# Patient Record
Sex: Female | Born: 1965 | Race: Black or African American | Hispanic: No | Smoking: Former smoker
Health system: Southern US, Community
[De-identification: ages and names within clinical notes are randomized; demographics above are authoritative.]

## PROBLEM LIST (undated history)

## (undated) DIAGNOSIS — H548 Legal blindness, as defined in USA: Secondary | ICD-10-CM

## (undated) DIAGNOSIS — D573 Sickle-cell trait: Secondary | ICD-10-CM

## (undated) DIAGNOSIS — E7800 Pure hypercholesterolemia, unspecified: Secondary | ICD-10-CM

## (undated) DIAGNOSIS — J45909 Unspecified asthma, uncomplicated: Secondary | ICD-10-CM

## (undated) DIAGNOSIS — I10 Essential (primary) hypertension: Secondary | ICD-10-CM

## (undated) HISTORY — PX: OTHER SURGICAL HISTORY: SHX169

## (undated) HISTORY — PX: RETINAL DETACHMENT SURGERY: SHX105

## (undated) HISTORY — PX: CHOLECYSTECTOMY: SHX55

## (undated) HISTORY — PX: CATARACT EXTRACTION, BILATERAL: SHX1313

## (undated) HISTORY — PX: ABDOMINAL HYSTERECTOMY: SHX81

---

## 1998-01-06 ENCOUNTER — Emergency Department (HOSPITAL_COMMUNITY): Admission: EM | Admit: 1998-01-06 | Discharge: 1998-01-06 | Payer: Self-pay | Admitting: Emergency Medicine

## 1998-02-26 ENCOUNTER — Ambulatory Visit (HOSPITAL_COMMUNITY): Admission: RE | Admit: 1998-02-26 | Discharge: 1998-02-26 | Payer: Self-pay | Admitting: Internal Medicine

## 1998-06-13 ENCOUNTER — Emergency Department (HOSPITAL_COMMUNITY): Admission: EM | Admit: 1998-06-13 | Discharge: 1998-06-13 | Payer: Self-pay | Admitting: Emergency Medicine

## 1999-05-20 ENCOUNTER — Emergency Department (HOSPITAL_COMMUNITY): Admission: EM | Admit: 1999-05-20 | Discharge: 1999-05-20 | Payer: Self-pay | Admitting: Emergency Medicine

## 1999-05-20 ENCOUNTER — Encounter: Payer: Self-pay | Admitting: Emergency Medicine

## 1999-05-21 ENCOUNTER — Encounter: Payer: Self-pay | Admitting: Emergency Medicine

## 1999-12-30 ENCOUNTER — Emergency Department (HOSPITAL_COMMUNITY): Admission: EM | Admit: 1999-12-30 | Discharge: 1999-12-30 | Payer: Self-pay | Admitting: *Deleted

## 1999-12-30 ENCOUNTER — Encounter: Payer: Self-pay | Admitting: *Deleted

## 2000-01-01 ENCOUNTER — Emergency Department (HOSPITAL_COMMUNITY): Admission: EM | Admit: 2000-01-01 | Discharge: 2000-01-01 | Payer: Self-pay | Admitting: Emergency Medicine

## 2001-09-24 ENCOUNTER — Other Ambulatory Visit: Admission: RE | Admit: 2001-09-24 | Discharge: 2001-09-24 | Payer: Self-pay | Admitting: Family Medicine

## 2001-11-06 ENCOUNTER — Ambulatory Visit (HOSPITAL_COMMUNITY): Admission: RE | Admit: 2001-11-06 | Discharge: 2001-11-18 | Payer: Self-pay | Admitting: Obstetrics and Gynecology

## 2001-11-18 ENCOUNTER — Encounter: Payer: Self-pay | Admitting: Obstetrics and Gynecology

## 2001-12-14 ENCOUNTER — Encounter: Payer: Self-pay | Admitting: Obstetrics and Gynecology

## 2001-12-14 ENCOUNTER — Inpatient Hospital Stay: Admission: AD | Admit: 2001-12-14 | Discharge: 2001-12-14 | Payer: Self-pay | Admitting: Obstetrics and Gynecology

## 2002-01-20 ENCOUNTER — Other Ambulatory Visit: Admission: RE | Admit: 2002-01-20 | Discharge: 2002-01-20 | Payer: Self-pay | Admitting: Obstetrics and Gynecology

## 2002-07-17 ENCOUNTER — Ambulatory Visit (HOSPITAL_COMMUNITY): Admission: RE | Admit: 2002-07-17 | Discharge: 2002-07-17 | Payer: Self-pay | Admitting: Obstetrics and Gynecology

## 2002-07-17 ENCOUNTER — Encounter: Payer: Self-pay | Admitting: Obstetrics and Gynecology

## 2003-06-13 ENCOUNTER — Emergency Department (HOSPITAL_COMMUNITY): Admission: EM | Admit: 2003-06-13 | Discharge: 2003-06-13 | Payer: Self-pay | Admitting: Emergency Medicine

## 2005-03-04 ENCOUNTER — Emergency Department (HOSPITAL_COMMUNITY): Admission: EM | Admit: 2005-03-04 | Discharge: 2005-03-04 | Payer: Self-pay | Admitting: Emergency Medicine

## 2005-08-12 ENCOUNTER — Emergency Department (HOSPITAL_COMMUNITY): Admission: EM | Admit: 2005-08-12 | Discharge: 2005-08-12 | Payer: Self-pay | Admitting: Family Medicine

## 2006-05-21 ENCOUNTER — Inpatient Hospital Stay (HOSPITAL_COMMUNITY): Admission: EM | Admit: 2006-05-21 | Discharge: 2006-05-24 | Payer: Self-pay | Admitting: *Deleted

## 2006-06-27 ENCOUNTER — Inpatient Hospital Stay (HOSPITAL_COMMUNITY): Admission: AD | Admit: 2006-06-27 | Discharge: 2006-07-01 | Payer: Self-pay | Admitting: Obstetrics and Gynecology

## 2006-06-28 ENCOUNTER — Encounter (INDEPENDENT_AMBULATORY_CARE_PROVIDER_SITE_OTHER): Payer: Self-pay | Admitting: *Deleted

## 2006-07-05 ENCOUNTER — Observation Stay (HOSPITAL_COMMUNITY): Admission: AD | Admit: 2006-07-05 | Discharge: 2006-07-06 | Payer: Self-pay | Admitting: Obstetrics and Gynecology

## 2006-07-07 ENCOUNTER — Inpatient Hospital Stay (HOSPITAL_COMMUNITY): Admission: AD | Admit: 2006-07-07 | Discharge: 2006-07-11 | Payer: Self-pay | Admitting: Obstetrics and Gynecology

## 2006-07-07 ENCOUNTER — Encounter: Payer: Self-pay | Admitting: Emergency Medicine

## 2006-07-10 ENCOUNTER — Encounter (INDEPENDENT_AMBULATORY_CARE_PROVIDER_SITE_OTHER): Payer: Self-pay | Admitting: *Deleted

## 2006-07-17 ENCOUNTER — Ambulatory Visit: Payer: Self-pay | Admitting: Internal Medicine

## 2006-10-09 ENCOUNTER — Ambulatory Visit (HOSPITAL_COMMUNITY): Admission: RE | Admit: 2006-10-09 | Discharge: 2006-10-09 | Payer: Self-pay | Admitting: General Surgery

## 2007-05-08 ENCOUNTER — Emergency Department (HOSPITAL_COMMUNITY): Admission: EM | Admit: 2007-05-08 | Discharge: 2007-05-08 | Payer: Self-pay | Admitting: Emergency Medicine

## 2007-05-22 ENCOUNTER — Emergency Department (HOSPITAL_COMMUNITY): Admission: EM | Admit: 2007-05-22 | Discharge: 2007-05-22 | Payer: Self-pay | Admitting: Family Medicine

## 2007-12-03 ENCOUNTER — Ambulatory Visit (HOSPITAL_COMMUNITY): Admission: RE | Admit: 2007-12-03 | Discharge: 2007-12-04 | Payer: Self-pay | Admitting: Ophthalmology

## 2008-02-29 ENCOUNTER — Emergency Department (HOSPITAL_COMMUNITY): Admission: EM | Admit: 2008-02-29 | Discharge: 2008-02-29 | Payer: Self-pay | Admitting: Emergency Medicine

## 2008-03-31 ENCOUNTER — Ambulatory Visit (HOSPITAL_COMMUNITY): Admission: RE | Admit: 2008-03-31 | Discharge: 2008-04-01 | Payer: Self-pay | Admitting: Ophthalmology

## 2009-11-22 ENCOUNTER — Ambulatory Visit: Payer: Self-pay | Admitting: Internal Medicine

## 2009-11-22 ENCOUNTER — Observation Stay (HOSPITAL_COMMUNITY): Admission: EM | Admit: 2009-11-22 | Discharge: 2009-11-22 | Payer: Self-pay | Admitting: Emergency Medicine

## 2010-10-18 LAB — POCT CARDIAC MARKERS
CKMB, poc: 1.4 ng/mL (ref 1.0–8.0)
CKMB, poc: <1 ng/mL — ABNORMAL LOW (ref 1.0–8.0)
Troponin i, poc: <0.05 ng/mL (ref 0.00–0.09)
Troponin i, poc: <0.05 ng/mL (ref 0.00–0.09)

## 2010-10-18 LAB — POCT I-STAT, CHEM 8
Chloride: 116 mEq/L — ABNORMAL HIGH (ref 96–112)
HCT: 30 % — ABNORMAL LOW (ref 36.0–46.0)
Potassium: 5.2 mEq/L — ABNORMAL HIGH (ref 3.5–5.1)
Sodium: 139 mEq/L (ref 135–145)

## 2010-10-18 LAB — BRAIN NATRIURETIC PEPTIDE: Pro B Natriuretic peptide (BNP): 35 pg/mL (ref 0.0–100.0)

## 2010-12-13 NOTE — Op Note (Signed)
NAME:  Kirsten Shaffer, Kirsten Shaffer         ACCOUNT NO.:  [id number]   MEDICAL RECORD NO.:  [id number]          PATIENT TYPE:  OIB   LOCATION:  5150                         FACILITY:  [facility]   PHYSICIAN:  Chalmers Guest, M.D.     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   PROCEDURES:  Anterior chamber washout and Optonol mini Ex-Press shunt  with mitomycin-C.   SURGEON:  Chalmers Guest, MD   COMPLICATIONS:  None.   ANESTHESIA:  General.   PROCEDURE:  The patient was taken to the operating room.  After  induction of general anesthesia, her face was prepped and draped in  usual sterile fashion with the surgeon sitting superiorly at the  operating microscope.  A 6-0 nylon suture was passed through clear  cornea to infraduct the eye.  With the eye in the infraduct position, a  Hoskins forceps was used to grasp the conjunctiva at the limbus.  A  Westcott scissor was used to incise the conjunctiva at the limbus and  the conjunctival tissue was relaxed using blunt dissection with the  blunt Westcott scissor.  A Weck-Cel sponge was used to recess the  conjunctiva and a Tooke blade was used to recess Tenon's tissue.  There  was bleeding present.  Bleeding was controlled with cautery.  There was  a small excision of Tenon's and vessels in the perilimbal area.  Additional cautery was applied.  Following this, a 45-degree blade was  used to fashion a half-thickness scleral flap with the base of 4 mm.  A  Maumenee-Colibri forceps was used to elevate the scleral flap and a 5700  Grieshaber  blade was used to dissect the flap to the limbus until the  limbal tissues were identified.  At this point, mitomycin-C 0.4 mg/mL  was placed on a Gelfoam sponge and placed under the conjunctiva and  under the sclera for 2 minutes.  It was then removed and irrigated with  60 mL of balanced salt solution.  At this point, a paracentesis was  formed through clear cornea at the 9 o'clock position and at the 4  o'clock position.  BSS was irrigated into the anterior chamber from an  irrigation bottle until the clot that was seen at the 6 o'clock position  had become very small and all pieces of the visible clot were gone, the  chamber deepened.  Following this, leaving the chamber deep, the scleral  flap again was elevated.  A 26-gauge needle was passed into the anterior  chamber under the scleral flap at the limbal junction after a small  amount of Provisc had been injected in the anterior chamber.  An  additional amount of Provisc was injected as the needle entered the eye.  The  needle was withdrawn and the Express shunt was a preloaded EDSP50,  SN# [id number], lot# [id number].  It was advanced through the aforementioned  opening and securely placed in the anterior chamber without touching the  cornea or the iris or the lens.  The forceps were used to hold the shunt  as the injector was removed.  Four interrupted 9-0 nylon sutures were  used to secure the scleral flap.  BSS was injected, the chamber  deepened, and the fluid egressed.  Following this, the conjunctiva was  then sutured with a 9-0 Vicryl on a BV-100 needle.  BSS was injected.  The incision was checked for leakage with fluorescein.  A small amount  of leakage was noted.  An additional suture was placed.  The chamber was  deepened and the incision was  tested with fluorescein and was now Seidel negative.  A subconjunctival  injection of Kenalog 4 mg was given at the 5 o'clock position.  Topical  TobraDex ointment was applied to the eye.   A patch and Fox shield were  placed.  After the anesthesia recovery, the patient returned to the  recovery area in stable condition.      Chalmers Guest, M.D.  Electronically Signed     RW/MEDQ  D:  04/01/2008  T:  04/01/2008  Job:  [id number]   cc:   Fax# [phone]

## 2010-12-13 NOTE — Op Note (Signed)
NAME:  Kirsten Shaffer, Kirsten Shaffer         ACCOUNT NO.:  [id number]   MEDICAL RECORD NO.:  [id number]          PATIENT TYPE:  AMB   LOCATION:  SDS                          FACILITY:  [facility]   PHYSICIAN:  John D. Ashley Royalty, M.D. DATE OF BIRTH:  14-Jan-1966   DATE OF PROCEDURE:  12/03/2007  DATE OF DISCHARGE:                               OPERATIVE REPORT   ADMISSION DIAGNOSIS:  Severe proliferative diabetic retinopathy, surface  membranes, neovascularization, and vitreous hemorrhage, left eye.   PROCEDURES:  Pars plana vitrectomy with membrane peel, retinal  photocoagulation, gas fluid exchange, endodiathermy in the left eye.   SURGEON:  Beulah Gandy. Ashley Royalty, MD   ASSISTANT:  Rosalie Doctor, MA   ANESTHESIA:  General.   PROCEDURE IN DETAIL:  After usual prep and drape, 25 gauge trocars  placed at 10, 2, and 4 o'clock.  Infusion at 4 o'clock.  Provisc placed  on the corneal surface.  The Biome viewing system moved into place.  Pars plana vitrectomy was begun just behind the cataractous lens.  The  vitrectomy was carried out into the peripheral vitreous area down to the  vitreous base for 360 degrees.  The vitreous was trimmed in this area.  Attention was then carried down to the macular region where a large  epiretinal neovascular membrane was covering the disk in the superior  and inferior arcades and the superior nasal arcades.  The membrane was  incised with the vitreous cutter and the lighted pick.  It was peeled  free from its attachment to the disk with forceps.  It was trimmed out  to its extent with the vitreous cutter.  Endo cautery was used as well  as the endolaser for hemostasis.  The plug was removed from the optic  nerve head.  The plane of posterior hyaloid was then followed out for  360 degrees past the equator and a surface proliferation was removed  from the retinal surface.  498 burns of endolaser was placed around the  retinal periphery and on leaky blood vessels.  The power  was 1000  milliwatts, 1000 microns each, and 0.1 seconds each.  All blood vessels  were sealed.  Silicone tip suction line was used to vacuum blood from  the retinal surface and it was cleaned at the end of the case.  A total  gas fluid exchange was performed.  The instruments were removed from the  eye and the conjunctiva was allowed to slide over the scleral wounds.  No leakage was seen.  Polymyxin and gentamicin were irrigated in Tenon  space.  Atropine solution was applied.  Marcaine was injected around the  globe for postop pain.  Decadron 10 mg was injected into the lower  subconjunctival space.  TobraDex ophthalmic ointment and patch and  shield were placed.  Closing pressure was 21 with a Barraquer tonometer.  Complications none.  Duration 45 minutes.  The patient was awakened and  taken to recovery in satisfactory condition.  Patch and shield were  placed.      Beulah Gandy. Ashley Royalty, M.D.  Electronically Signed    JDM/MEDQ  D:  12/03/2007  T:  12/04/2007  Job:  [id number]

## 2010-12-16 NOTE — Discharge Summary (Signed)
NAMEDONNE, ROBILLARD                  ACCOUNT NO.:  [id number]   MEDICAL RECORD NO.:  [id number]          PATIENT TYPE:  INP   LOCATION:  9308                          FACILITY:  [facility]   PHYSICIAN:  Dineen Kid. Rana Snare, M.D.    DATE OF BIRTH:  1965/11/26   DATE OF ADMISSION:  06/27/2006  DATE OF DISCHARGE:  07/01/2006                               DISCHARGE SUMMARY   HISTORY OF PRESENT ILLNESS:  [name] is a 45 year old gravida 3, para  2, abortus 1, with a history of large uterine fibroids and anemia.  She  is status post transfusion on May 22, 2006.  She left the hospital  with bleeding controlled on birth control pills and iron until  approximately 1 week ago.  She was scheduled for a hysterectomy in  January, per the patient's request to put it off until then.  She  presented to the office with increased brisk bleeding and symptoms of  anemia, had a hemoglobin down to 6.5.  She was admitted because of  increased pain, bleeding, and brisk bleeding with standing, and weakness  with leg pain.  She does have a history of non-insulin dependent  diabetes mellitus which was adult onset.  She is currently on insulin  and Amaryl.   HOSPITAL COURSE:  The patient was admitted and transfused 2 units of  packed red blood cells.  Her hemoglobin did increase to 9.1, but because  of increased pain and continued bleeding, we did decide to proceed with  hysterectomy more on an urgent basis.  On June 28, 2006, she  underwent a total abdominal hysterectomy.  The estimated blood loss  during the procedure was 250 mL.  The procedure itself was  uncomplicated.  Her postoperative care was similarly uncomplicated.  She  had a postoperative hemoglobin of 9.1.  She had her diet advanced to a  regular diet and by postoperative day #3 was tolerating a regular  diabetic diet without difficulty, ambulating without difficulty, was  able to pass flatus.  The incisional and abdominal pain was controlled  with oral  pain medications.  The incision was clean, dry, and intact  with normal active bowel sounds at the time of discharge.  On December  2, the patient was discharged home after the staples removed with follow  up in the office in 2 weeks.   DISPOSITION:  The patient will follow up in the office in 2 weeks for  incision check, told to return for increased pain, fever, bleeding.  I  sent her home with a prescription for Oxycodone #30.   DISCHARGE DIAGNOSES:  1. Severe anemia, requiring transfusion.  2. Abnormal uterine bleeding.  3. 12 week size fibroids.  4. Pelvic pain.   CONDITION ON DISCHARGE:  Good.      Dineen Kid Rana Snare, M.D.  Electronically Signed     DCL/MEDQ  D:  07/01/2006  T:  07/02/2006  Job:  [id number]

## 2010-12-16 NOTE — Discharge Summary (Signed)
Kirsten Shaffer, Kirsten Shaffer                  ACCOUNT NO.:  [id number]   MEDICAL RECORD NO.:  [id number]          PATIENT TYPE:  INP   LOCATION:  6731                         FACILITY:  [facility]   PHYSICIAN:  Jackie Plum, M.D.DATE OF BIRTH:  02/11/1966   DATE OF ADMISSION:  05/21/2006  DATE OF DISCHARGE:  05/24/2006                                 DISCHARGE SUMMARY   REASONS FOR CONSULT:  1. Menorrhagia secondary to uterine fibromata result.  2. Anemia of blood loss.  3. Adult-onset diabetes mellitus, diet controlled.  4. History of sickle-cell trait.  5. Attention deficit disorder.   DISCHARGE MEDICATIONS:  The patient is going to be taking oral birth control  pills, Nordette, 1 twice daily for 5 days and then 1 daily thereafter.  She  is also going to get Vicodin p.r.n. for pain q.4-6 h. p.r.n.   DIET:  Should be diabetic diet.   CONSULTANTS:  Dr. Rachell Cipro of gynecology.   PROCEDURES:  Ultrasound transvaginally.   CONDITION ON DISCHARGE:  Satisfactory.   DISCHARGE LAB:  Hemoglobin 8.0, hematocrit 24.6.  Hemoglobin A1c 10.3% on  May 23, 2006.  The patient will need outpatient followup for diabetes by  PCP and adjust in her diabetic medication regimen.   REASON FOR ADMISSION:  Menorrhagia.  For full details regarding the  patient's presentation, please refer the H&P dictated by Dr. Derenda Mis  on May 21, 2006, regarding patient's presenting symptoms, signs, and lab  work.  The patient presented with menorrhagia.  She had a history of uterine  fibromata.  She had been having heavy menses for about a week.  Hemoglobin  7.6.  She was therefore admitted for further evaluation.  On admission, the  patient was transfused with packed red blood cells.  She was improved and  was seen by Dr. Rana Snare, who added the patient on hormone treatment with  resolution of the patient's menorrhagia.  The patient is planned to follow  up with him in the gynecology clinic next week.  When asked today,  patient  denies any fever or chills, nausea or vomiting.  She has had some cramping,  lower abdominal pain which is being controlled with analgesics.  Troponins  are unremarkable.  Abdomen is soft.  She has mild lower abdominal tenderness  on palpation.  No real guarding or  rebound tenderness.  Her discharge vital signs indicated BP 102/64, pulse  69, respirations 19, temp 98.8 degrees Fahrenheit, and she is being  discharged home in stable obstetric condition to follow up with her primary  gynecologist.      Jackie Plum, M.D.  Electronically Signed     GO/MEDQ  D:  05/24/2006  T:  05/25/2006  Job:  [id number]   cc:   Dineen Kid. Rana Snare, M.D.

## 2010-12-16 NOTE — Discharge Summary (Signed)
Kirsten Shaffer, Kirsten Shaffer                  ACCOUNT NO.:  [id number]   MEDICAL RECORD NO.:  [id number]          PATIENT TYPE:  INP   LOCATION:  9318                          FACILITY:  [facility]   PHYSICIAN:  Lebron Conners, M.D.   DATE OF BIRTH:  [birth year]   DATE OF ADMISSION:  07/07/2006  DATE OF DISCHARGE:  07/11/2006                               DISCHARGE SUMMARY   HISTORY:  This is a 45 year old woman who had recently had a  hysterectomy and who was readmitted to the hospital by Dr. Arelia Sneddon  because of abdominal pain and concern about the possible complications.  The patient remained stable and was treated with antibiotics.  On  December 10 she was seen by gastroenterology because of epigastric and  right upper quadrant pain, fever, nausea, and vomiting.  Cholelithiasis  was found on ultrasound.  The ultrasound also showed swelling of the  gallbladder consistent with acute cholecystitis.  Dr. Juanda Chance recommended  that I see the patient.  I saw her on July 09, 2006, and agreed with  the diagnosis of cholelithiasis and acute cholecystitis.  The patient  continued to improve with antibiotics.  The next day I performed a  laparoscopic cholecystectomy on the patient.  The operation went well  and there were no complications, and she continued to improve and went  home the next day.  I made arrangements to follow her up in the office  in 2-3 weeks.  All medical issues were stable at the time of discharge.   DIAGNOSES:  1. Cholelithiasis and acute and chronic cholecystitis.  2. Status post recent hysterectomy.   OPERATION:  Laparoscopic cholecystectomy.   DISCHARGE CONDITION:  Improved and further improving daily.   FINAL DIAGNOSIS:  Acute and chronic cholecystitis and cholelithiasis.      Lebron Conners, M.D.  Electronically Signed     WB/MEDQ  D:  09/18/2006  T:  09/18/2006  Job:  [id number]   cc:   Juluis Mire, M.D.  Fax: [phone]

## 2010-12-16 NOTE — H&P (Signed)
Kirsten Shaffer, Kirsten Shaffer                  ACCOUNT NO.:  [id number]   MEDICAL RECORD NO.:  [id number]          PATIENT TYPE:  INP   LOCATION:  6731                         FACILITY:  [facility]   PHYSICIAN:  Melissa L. Ladona Ridgel, MD  DATE OF BIRTH:  [birth year]   DATE OF ADMISSION:  05/21/2006  DATE OF DISCHARGE:                                HISTORY & PHYSICAL   CHIEF COMPLAINT:  Vaginal bleeding.   HISTORY OF PRESENT ILLNESS:  The patient is a 45 year old African American  female who states that three weeks ago, she started having heavy menses that  lasted approximately one week and three weeks to the day, she has started  again with a very heavy menstrual cycle with clots and using a pad an hour.  Over the weekend, the patient was noted to be significantly weak with  dizziness.  She actually had several small pre-syncopal events.  She states  she slept most of the weekend.  The patient relates that two years ago, she  was evaluated for possible surgery for fibroid disease but, at the time, Dr.  Thomasena Edis referred her to another physician by the name of Dr. Carey Bullocks.  They  elected not to do the surgery because her blood sugars were not well  controlled at the time.  Since that time, the patient has lost over 150  pounds, has really had no trouble with her menstrual cycles or the fibroids.  She states that after losing the weight, she had significant improvement in  her pelvic pain and had no heavy menses until this time.  The patient will  be admitted for symptomatic anemia secondary to vaginal bleeding.   REVIEW OF SYMPTOMS:  Two years ago, she had birth control shot, nothing  since.  She had a tubal ligation 17 years ago.  She does not use any birth  control.  She denies fevers but did have some chills.  She denies nausea and  vomiting but did have some chest tightness, more significant and chest pain  that appeared to be more like she could not catch her breath.  This  currently is resolved.   The patient states that she did have a fainting  spell two months ago but nothing since and she was not having heavy menses  at the time.  She is having no pelvic pain.  The patient has no religious  objections to transfusion.   PAST MEDICAL HISTORY:  Diabetes, fibroid disease.   PAST SURGICAL HISTORY:  Two C-sections.   SOCIAL HISTORY:  She denies tobacco.  She does drink occasional alcohol.  She has two children, has had three pregnancies with one miscarriage.   FAMILY HISTORY:  Mom is living in recovery from drug abuse, has diabetes.  Her father is  unknown to her.   ALLERGIES:  No known drug allergies.   MEDICATIONS:  None.   PHYSICAL EXAMINATION:  VITAL SIGNS:  The patient is significantly orthostatic from the lying to  sitting position, no standing blood pressures were obtained because of the  significant symptoms with going to an upright position.  Her blood pressure  lying was 107/64 with a heart rate of 110, in the sitting position, 100/60  with a heart rate of 118 and she was significantly dizzy.  Temperature 97.4,  respirations 12, saturations 98%.  GENERAL:  The patient is extremely pale, her mucous membranes are dry.  NECK:  Supple, no JVD, no lymph nodes, no carotid bruits.  CHEST:  Clear to auscultation, no rales, rhonchi, or wheezes.  CARDIOVASCULAR:  Regular rate and rhythm, positive S1 and S2, no S3 or S4,  no murmurs, gallops, and rubs.  ABDOMEN:  Soft, nontender, nondistended, positive bowel sounds.  EXTREMITIES:  No cyanosis, clubbing, and edema.  NEUROLOGICAL:  The patient is awake, alert and oriented.  Cranial nerves 2-  12 are intact.  Power is 5/5.  DTRs are 2+.  Plantars are downgoing.   LABORATORY DATA:  White count 9.6, hemoglobin 7.6, hematocrit 23.9,  platelets 381.  Sodium 134, potassium 3.9, chloride 103, CO2 26, BUN 6,  creatinine is pending, glucose 406.  Point of care enzymes were negative x1.   ASSESSMENT AND PLAN:  This is a 45 year old  African American female with  known fibroid disease who was evaluated two years ago for possible surgery  but did not undergo that secondary to poorly controlled blood sugars.  The  patient subsequently has lost 150 pounds and has had no further problems  until recently.  She now has started having heavy menstrual periods that are  three weeks apart and she currently has symptomatic anemia.   1. Blood loss anemia, symptomatic, menometrorrhagia.  For now, bedrest      with telemetry.  Will transfuse 2 units packed red cells and recheck      for further transfusion.  I will speak to the GYN person on call to      consider possible hormone therapy.  The patient states in the past she      has had swelling with injectable birth control.  2. Diabetes.  She was admitted with a blood sugar of 406, insulin was      given and she is now down to 171.  Will check a hemoglobin A1c.      Sliding scale insulin and carb modified medium diet.  3. Cardiovascular.  Tachycardia likely secondary to acute blood loss, will      transfuse, bedrest, and check fasting lipid panel.  4. Endocrine.  We will check a TSH.  5. SCDs for DVT prophylaxis for now until I speak with OB/GYN.   ADDENDUM:  I did speak with Dr. Nicholas Lose who is covering for Dr. Thomasena Edis.  He  stated it would be best to give the patient Nordette which is a combination  oral contraceptive, that should be given twice daily or the formulary  equivalent.  She likely should be given aspirin 81 mg for DVT prophylaxis  and the primary team will be requested to please contact Dr. Thomasena Edis in the  morning to update and check on other orders.      Melissa L. Ladona Ridgel, MD  Electronically Signed     MLT/MEDQ  D:  05/23/2006  T:  05/23/2006  Job:  [id number]   cc:   Dario Guardian, M.D.

## 2010-12-16 NOTE — Op Note (Signed)
Kirsten Shaffer, Kirsten Shaffer                  ACCOUNT NO.:  [id number]   MEDICAL RECORD NO.:  [id number]          PATIENT TYPE:  INP   LOCATION:  9318                          FACILITY:  [facility]   PHYSICIAN:  Lebron Conners, M.D.   DATE OF BIRTH:  22-Jul-1966   DATE OF PROCEDURE:  07/10/2006  DATE OF DISCHARGE:                               OPERATIVE REPORT   PREOPERATIVE DIAGNOSES:  1. Cholelithiasis  2. Acute cholecystitis.   POSTOPERATIVE DIAGNOSES:  1. Cholelithiasis  2. Acute cholecystitis.   PROCEDURE:  Laparoscopic cholecystectomy.   SURGEON:  Lebron Conners, M.D.   ANESTHESIA:  General and local.   SPECIMENS:  Gallbladder and stones.   BLOOD LOSS:  Minimal.   COMPLICATIONS:  None.   PROCEDURE:  After the patient was monitored and asleep and had routine  preparation and draping of the abdomen, I infiltrated the area just  below the umbilicus with long-acting local anesthetic and made about a  2.5 cm transverse incision.  I dissected down through scar tissue and  fat to the midline fascia and incised it for about 2 cm longitudinally.  I then very carefully entered the peritoneal cavity by blunt and sharp  dissection and found that there were no adhesions of viscera to that  area.  I placed 0 Vicryl pursestring suture in the fascia and secured a  Hassan cannula and inflated the abdomen with carbon dioxide.  The area  of the hysterectomy was somewhat obscured by adhesions of omentum to the  anterior abdominal wall, but I saw no acute problems.  The gallbladder  was mildly distended and edematous.  I then anesthetized three  additional sites and put in an 11 mm epigastric port and two 5 mm right  mid abdominal ports under direct view noting that the viscera were not  injured with insertion of the ports.  With the patient positioned head-  up, foot down and tilted to the left, I grasped the fundus of the  gallbladder and elevated it toward the right shoulder.  There were  adhesions  of the omentum and duodenum to the undersurface of the  gallbladder and liver and I took those down with sharp and cautery  dissection.  I then was able to grasp the infundibulum of the  gallbladder and pull it laterally and I carefully dissected the  hepatoduodenal ligament.  I first identified the cystic artery and  clipped and divided it.  I then dissected out the cystic duct until it  was clearly the cystic duct emerging from the infundibulum and I had a  nice large window between the liver and gallbladder and cystic duct.  I  then clipped the cystic duct with four clips and cut between the two  closest to the gallbladder.  I then dissected the gallbladder from the  liver using the cautery and got hemostasis with the cautery.  I  irrigated the area and removed irrigant and checked that the clips were  secure on that bleeding was stopped.  I then removed the gallbladder by  pulling it up to the umbilical incision.  The stones were so big that  they would not come out but pulling on the gallbladder even though I  enlarged the incision a bit.  I therefore opened the gallbladder and  dissected down in into it and grasp the stones with a ring forceps and  broken them up and was finally able to remove the gallbladder.  No  stones were spilled into the abdominal cavity.  I then tied the  pursestring suture and irrigated the umbilical incision out where some  bile had contaminated it slightly.  I then inspected the surgical area  again and irrigated a little more and removed the irrigant.  The sponge,  needle and instrument counts were correct.  I removed the two lateral  ports under direct view and saw no bleeding from the belly wall.  I then  allowed the carbon dioxide to escape through the epigastric port and  removed that as well.  I closed all skin incisions with intracuticular 4-  0 Vicryl and Steri-Strips and applied bandages.  She went to PACU in  good condition.  Specimen was sent to  the lab.      Lebron Conners, M.D.  Electronically Signed     WB/MEDQ  D:  07/10/2006  T:  07/10/2006  Job:  [id number]   cc:   Dineen Kid. Rana Snare, M.D.  Fax: [phone]

## 2010-12-16 NOTE — Consult Note (Signed)
Kirsten Shaffer, Kirsten Shaffer                  ACCOUNT NO.:  [id number]   MEDICAL RECORD NO.:  [id number]          PATIENT TYPE:  INP   LOCATION:  9318                          FACILITY:  [facility]   PHYSICIAN:  Lebron Conners, M.D.   DATE OF BIRTH:  February 14, 1966   DATE OF CONSULTATION:  07/09/2006  DATE OF DISCHARGE:                                 CONSULTATION   CHIEF COMPLAINT:  Abdominal pain.   HISTORY OF PRESENT ILLNESS:  The patient is a 45 year old white female  who works as a Engineer, drilling on [employer] who has been vomiting and had some  upper abdominal pain primarily since her hysterectomy on November 29.  The findings of that were benign. She has not had jaundice, dark urine  or light stools, and her liver tests have been normal. She has had some  fever but no elevation of her white blood cell count. She became  somewhat dehydrated and was admitted for IV fluids. She was sent home,  but then symptoms occurred, and she was brought back into [facility]. Her hysterectomy was done by Dr. Rana Snare. The patient has been  started on Unasyn and Protonix. She is mildly anemic. Otherwise, the  labs are unremarkable. Amylase was normal. The KUB showed four large  gallstones, and gallbladder ultrasound showed edema of the gallbladder  wall. The patient has gotten good relief of pain and nausea with  intravenous medication.   PAST MEDICAL HISTORY:  She is a type 2 diabetic. She used Lantus  insulin, glipizide. She had taken a Z-Pak for some type of infection.  She has no medicine allergies. There has been a problem with asthma in  the past. The patient does not smoke or drink.   REVIEW OF SYSTEMS:  No chest pain. No signs of any heart problems such  as chest pain or shortness of breath. She was found on chest x-ray to  have small bilateral pleural effusions. No chronic GI symptoms except as  mentioned. The patient had hysterectomy for menorrhagia.   PHYSICAL EXAMINATION:  Temperature at this time normal.  Vital signs  normal. No acute distress.  Head and neck exam unremarkable. No sclerae icterus. No problem with the  thyroid gland as it is not enlarged, and there is no thyroid nodule. No  neck mass. No cervical lymphadenopathy.  CHEST:  Is clear to auscultation. No chest wall tenderness.  BREASTS:  Normal.  HEART:  Rate and rhythm normal. No murmur or gallop.  ABDOMEN:  Mild right upper quadrant tenderness without rebound. The  belly is soft. There is no hernia. There is a healing Pfannenstiel-type  incision.  EXTREMITIES:  Normal. No edema. Good pulses. No skin lesions.  LYMPH NODES:  None enlarged in the groin.  NEUROLOGICAL:  Grossly normal.   IMPRESSION:  1. Acute cholecystitis and cholelithiasis, improving on IV antibiotics      and limitation of oral intake.  2. Type 2 diabetes.  3. Recent hysterectomy, recovering in a satisfactory way.   PLAN:  Urgent laparoscopic cholecystectomy to be done within a day or  two. We  will maintain her on oral antibiotics until then.      Lebron Conners, M.D.  Electronically Signed     WB/MEDQ  D:  07/09/2006  T:  07/09/2006  Job:  [id number]   cc:   Dineen Kid. Rana Snare, M.D.  Fax: [phone]   Hedwig Morton. Juanda Chance, MD  [address]  [place]  [place] [address]

## 2010-12-16 NOTE — Consult Note (Signed)
Kirsten Shaffer, Kirsten Shaffer                  ACCOUNT NO.:  [id number]   MEDICAL RECORD NO.:  [id number]          PATIENT TYPE:  INP   LOCATION:  6731                         FACILITY:  [facility]   PHYSICIAN:  Dineen Kid. Rana Snare, M.D.    DATE OF BIRTH:  04-09-1966   DATE OF CONSULTATION:  05/22/2006  DATE OF DISCHARGE:                                   CONSULTATION   DATE OF CONSULTATION:  May 22, 2006   HISTORY OF PRESENT ILLNESS:  I was asked to see Kirsten Shaffer for evaluation of  abnormal uterine bleeding and anemia.  She is a 45 year old G3, P2, A1, who  was admitted to the hospital for severe anemia symptomatic, underwent blood  transfusion.  Her history is significant for history of uterine fibroids,  followed by Dr. Artist Pais 3-4 years ago, had multiple ultrasounds, was  scheduled for hysterectomy, the patient declined at that time.  Since, has  lost a lot of weight.  Was also having problems with diabetes and she felt  that her periods had actually improved, as far as pain and bleeding until  recently over the last several months her periods have been excessively  heavy, passing large clots and in the last month more frequent being 2 weeks  into her menstrual cycle and then bleeding through the entire month up until  the time she presented to the hospital.  She also complains of cramping with  her periods, mostly recently.  She has had a tubal ligation in the past with  her second cesarean section.  Has had some problems with control of the  diabetes and is currently going back on medication with her primary doctor  for further control of this.  The patient reports menarche at age 68.  She  has had 3 pregnancies, including a miscarriage.  She has had a tubal  ligation, but required 2 cesarean sections.   Her past medical history includes:  1. Adult onset diabetes.  2. History of sickle cell trait.  3. Attention deficit disorder.   PAST SURGICAL HISTORY:  Two cesarean sections and tubal  ligation.   MEDICATIONS:  Adderall and then on a new diabetes medication.   SHE HAS NO KNOWN DRUG ALLERGIES.  PREVIOUSLY WAS LISTED AS PENICILLIN, BUT  SHE HAS TAKEN PENICILLIN WITHOUT PROBLEMS IN THE PAST.   LABORATORY EVALUATION:  She presented with a hemoglobin of 7.6, hemoglobin  after 2 units of blood is 9.0.  Remaining laboratory evaluation is  essentially normal.   VITAL SIGNS:  She is stable and she is afebrile.  Currently she is not  symptomatic.   Pelvic ultrasound today reveals the uterus measuring 12.9 x 10 x 6.3 with  large fibroids measuring 6.5, another one measuring 6.3, another one  measuring 3.9, off the fundus of the uterus.  Unable to clearly see the  endometrial stripe.  No mention of the ovaries was noted on the ultrasound.  This was compared to previous ultrasound in 2003 showing some interval  growth.   PHYSICAL EXAMINATION:  GENERAL:  Kirsten Shaffer is alert and oriented  x3.  HEART:  Regular rate and rhythm.  LUNGS:  Clear to auscultation bilaterally.  ABDOMEN:  Nontender.  You can feel the fundus of the uterus 2-3  fingerbreadths above the pubic symphysis, minimally tender to deep  palpation.  PELVIC:  Deferred due to patient request and also pelvic ultrasound  performed 2 hours before examination.  She does report bleeding has slowed  significantly.  She does have occasional clots when she does go to the  bathroom, but otherwise is not soaking through pads at this time.   IMPRESSION AND PLAN:  1. Menometrorrhagia.  2. Twelve week size fibroids.  3. Anemia, symptomatic.   I had a long discussion with Kirsten Shaffer and her family.  She will ultimately  require a hysterectomy, most likely done abdominally, due to the fact that  the fibroids have grown and continue to cause problems, including anemia and  pain.  My goal at this time would be to stabilize her anemia with the use of  birth control pills.  She has no risk factors associated with this.  She is  a  nonsmoker and has no history of liver disease.  I recommend we continue  with Nordette twice daily for the next 5 days and then she can go back to  once daily.  Would recommend she skip the placebos.  Recommend iron twice  daily so we can increase the blood count.  Would recommend that she follow  up with me in the next 1-2 weeks in the office.  At that time, we will  perform a saline infusion ultrasound for further evaluation of the  endometrial lining, which was not seen on the ultrasound today in the  office.  With that, we can evaluate the lining for endometrial pathology,  possibly require endometrial biopsy.  The patient would like to wait on  surgery until January if at all possible.  If we get good control of the  bleeding with the birth control pills and nothing suspicious for neoplasia,  we may be able to hold her off until January, at which time her hemoglobin  should be in a normal range and would be less risky.  I would also recommend  that she get tight control of her diabetes to furthermore increase the  likelihood of doing well postoperatively.  We did discuss the surgery.  We  prefer to preserve the ovaries because of her young age, at 74.  Discussed  the procedure itself, the recovery, and some of the risks and benefits  associated with it and also with the diabetes.  She will call the office  when she is discharged home.  I did write a prescription for Vicodin.  She  can take as needed for pain.  The pain really began more after the pelvic  ultrasound.  Otherwise, she is relatively asymptomatic.      Dineen Kid Rana Snare, M.D.  Electronically Signed     DCL/MEDQ  D:  05/22/2006  T:  05/23/2006  Job:  [id number]

## 2010-12-16 NOTE — Discharge Summary (Signed)
NAMEJYRAH, Kirsten Shaffer                  ACCOUNT NO.:  [id number]   MEDICAL RECORD NO.:  [id number]          PATIENT TYPE:  OBV   LOCATION:  9308                          FACILITY:  [facility]   PHYSICIAN:  Dineen Kid. Rana Snare, M.D.    DATE OF BIRTH:  February 22, 1966   DATE OF ADMISSION:  07/05/2006  DATE OF DISCHARGE:  07/06/2006                               DISCHARGE SUMMARY   OUTPATIENT 23-HOUR OBSERVATION   HISTORY OF PRESENT ILLNESS:  [name] is a 45 year old status post  hysterectomy for severe anemia, bleeding and fibroids, one week ago with  surgery not complicated other than she did require a blood transfusion  preoperatively.  She was discharged home on postoperative day #3 and was  doing very well for several days at home.  She had several people who  had been sick around her.  She began having some sinusitis-type symptoms  and then began having nausea and vomiting similar to what her other  family members were having  Because of severe nausea and vomiting she  was unable to take her pain control medications and because of her  diabetes was unable to stay hydrated.  She presented to [facility] Triage  for intravenous fluids.   HOSPITAL COURSE:  Laboratory evaluation was normal as well as her exam,  other than exam was consistent with a viral illness. She was doing  better after IV fluids, however, because her son is also currently  having problems and having a sickle cell crisis, the patient's mother  asked if she could be kept overnight for further fluids and anti-emetic  therapy because she would be unable to care for her at home, so we  decided we would keep her overnight on IV fluids.   The patient continued with IV fluids after the initial bolus. We were  able to maintain her glucose levels in a normal range using sliding  scale insulin, anti-emetics such as Zofran and Phenergan were used to  control the nausea.  On July 06, 2006 in the morning she was able to  tolerate a light  breakfast, was able to keep fluids down, still feeling  weak, but she is passing flatus and has normal active bowel sounds.  Her  abdomen is soft, nontender, nondistended.  The incision is clean, dry  and intact.   LABORATORY DATA:  Her laboratory evaluation on July 06, 2006 shows an  essentially normal comprehensive metabolic package with the exception of  elevated glucose.  Her hemoglobin is otherwise 8.9 which is consistent  with her recent hospitalization with white count of 7.2.   DISPOSITION:  The patient will be discharged home today and will  followup in the office in one week.  She will be sent home with Zofran,  Phenergan and a refill on her Vicodin.  She knows to call or return for  increased pain, fever, bleeding or inability to keep down fluids.   CONDITION ON DISCHARGE:  Good.   DISCHARGE DIAGNOSES:  1. Viral illness.  2. Sinusitis.  3. Intractable nausea and vomiting.  4. Diabetes.  Dineen Kid Rana Snare, M.D.  Electronically Signed     DCL/MEDQ  D:  07/06/2006  T:  07/06/2006  Job:  [id number]

## 2010-12-16 NOTE — Op Note (Signed)
Kirsten Shaffer, Kirsten Shaffer                  ACCOUNT NO.:  [id number]   MEDICAL RECORD NO.:  [id number]          PATIENT TYPE:  INP   LOCATION:  9308                          FACILITY:  [facility]   PHYSICIAN:  Dineen Kid. Rana Snare, M.D.    DATE OF BIRTH:  03-Dec-1965   DATE OF PROCEDURE:  06/28/2006  DATE OF DISCHARGE:                               OPERATIVE REPORT   PREOPERATIVE DIAGNOSIS:  Pelvic pain, abnormal uterine bleeding, 12  weeks size fibroid uterus, and anemia requiring blood transfusion.   POSTOPERATIVE DIAGNOSIS:  Pelvic pain, abnormal uterine bleeding, 12  weeks size fibroid uterus, and anemia requiring blood transfusion.   PROCEDURE:  Total abdominal hysterectomy with lysis of adhesions.   SURGEON:  Dineen Kid. Rana Snare, M.D.   ASSISTANT:  Kirsten Shaffer, M.D.   ANESTHESIA:  General endotracheal anesthesia.   INDICATIONS FOR PROCEDURE:  [name] is a 45 year old G3, P2, A1, with a  large uterus with fibroids who had required transfusion one month ago.  I felt she was stable hemodynamically on birth control pills.  She  presented to my office two days ago with worsening bleeding, pain, and  anemia.  She required two more units of packed red blood cells for  anemia.  Because of ongoing severe pain and large fibroids, we will  proceed with abdominal hysterectomy.  The risks and benefits of the  procedure were discussed at length and informed consent was obtained.   OPERATIVE FINDINGS:  A large 12 to 14 weeks size fibroid uterus,  adhesions from the bladder to the uterus from previous cesarean  sections.  Normal appearing ovaries and fallopian tubes.   DESCRIPTION OF PROCEDURE:  After adequate analgesia, the patient was  placed in the supine position. She was sterilely prepped and draped.  A  Foley catheter was sterilely placed.  A Pfannenstiel skin incision was  made 2 fingerbreadths above the pubic symphysis and taken down sharply  to the fascia which was incised transversely, extended  superiorly and  inferiorly to the rectus muscle.  The peritoneum was entered sharply.  A  large uterus was noted.  An O'Connor-O'Sullivan retractor was placed.  The bowel was packed cephalad.  The uterus was grasped and elevated  through the incision.  Kelly clamps were placed across the utero-ovarian  ligaments bilaterally.  A LigaSure device was used to ligate across the  round ligaments bilaterally and dissect the bladder off the anterior  surface of the cervix.  The LigaSure was used to coagulate and then Mayo  scissors were used to dissect across the utero-ovarian ligaments  bilaterally down across the inferior portion of the broad ligaments.  After the bladder was carefully dissected off the anterior surface of  the cervix through some dense adhesions from previous cesarean sections,  a LigaSure was used to ligate across the uterine vasculature  bilaterally.  The uterine corpus was then removed and handed off to the  pathologist.  The cervix was then grasped with a Kocher clamp.  The  bladder was dissected further off the anterior surface of the cervix and  LigaSure was  used to ligate across the cardinal ligaments.  Mayo  scissors were used to dissect and Heaney clamps were placed across the  uterosacral ligaments, entered the vagina, and the remaining portion of  the cervix was removed, noted to have complete cervix removed.  Angled  sutures of 0 Monocryl were placed through the uterosacral ligaments and  the angle of the vagina.  The vagina was then closed with figure-of-  eight sutures of 0 Monocryl.  The uterosacral ligaments were then  plicated in the midline.  After careful inspection and a copious amount  of irrigation, adequate hemostasis was assured.  The ovaries were then  tacked to the round ligaments with good support noted.  The bowel  packing was removed.  The O'Connor-O'Sullivan was removed.  The  peritoneum was closed with a 0 Monocryl running suture, the fascia was   closed with 0 PDS in a running fashion.  Irrigation was applied and  after adequate hemostasis was deemed stable, Steri-Strips were applied.  The patient tolerated the procedure well and was stable on transfer to  the recovery room.  Sponge and instrument counts were correct x 3.  Estimated blood loss was 250 mL.  The patient received 1 gram of  Cefotetan preoperatively.      Dineen Kid Rana Snare, M.D.  Electronically Signed     DCL/MEDQ  D:  06/29/2006  T:  06/29/2006  Job:  [id number]

## 2011-04-25 LAB — CBC
HCT: 32.1 — ABNORMAL LOW
Hemoglobin: 11.1 — ABNORMAL LOW
Platelets: 258
WBC: 9.2

## 2011-04-25 LAB — BASIC METABOLIC PANEL
BUN: 25 — ABNORMAL HIGH
Calcium: 9.4
GFR calc non Af Amer: >60
Potassium: 5
Sodium: 137

## 2011-04-28 LAB — COMPREHENSIVE METABOLIC PANEL
ALT: 43 — ABNORMAL HIGH
AST: 27
Albumin: 4.2
Alkaline Phosphatase: 91
BUN: 23
Chloride: 107
GFR calc Af Amer: >60
Potassium: 4.3
Sodium: 135
Total Bilirubin: 1

## 2011-04-28 LAB — DIFFERENTIAL
Basophils Absolute: 0.1
Basophils Relative: 1
Eosinophils Absolute: 0.1
Eosinophils Relative: 1
Monocytes Absolute: 0.5
Monocytes Relative: 5

## 2011-04-28 LAB — CBC
HCT: 35.4 — ABNORMAL LOW
Hemoglobin: 12.2
MCHC: 34.3
MCV: 82.2
Platelets: 226
RBC: 4.31
RDW: 13.7
WBC: 9.3

## 2011-04-28 LAB — GLUCOSE, CAPILLARY

## 2011-05-03 LAB — BASIC METABOLIC PANEL
Chloride: 113 — ABNORMAL HIGH
GFR calc non Af Amer: >60
Glucose, Bld: 124 — ABNORMAL HIGH
Potassium: 3.9
Sodium: 137

## 2011-05-03 LAB — GLUCOSE, CAPILLARY
Glucose-Capillary: 136 — ABNORMAL HIGH
Glucose-Capillary: 166 — ABNORMAL HIGH
Glucose-Capillary: 172 — ABNORMAL HIGH

## 2011-05-03 LAB — CBC
HCT: 30.5 — ABNORMAL LOW
Hemoglobin: 10.2 — ABNORMAL LOW
RDW: 13.3
WBC: 7.9

## 2011-07-04 ENCOUNTER — Encounter: Payer: Self-pay | Admitting: *Deleted

## 2011-07-04 ENCOUNTER — Emergency Department (HOSPITAL_COMMUNITY)
Admission: EM | Admit: 2011-07-04 | Discharge: 2011-07-04 | Disposition: A | Discharge status: Home / Self Care | Payer: Medicare Other | Attending: Emergency Medicine | Admitting: Emergency Medicine

## 2011-07-04 ENCOUNTER — Other Ambulatory Visit: Payer: Self-pay

## 2011-07-04 DIAGNOSIS — I10 Essential (primary) hypertension: Secondary | ICD-10-CM | POA: Insufficient documentation

## 2011-07-04 DIAGNOSIS — R112 Nausea with vomiting, unspecified: Secondary | ICD-10-CM | POA: Insufficient documentation

## 2011-07-04 DIAGNOSIS — E119 Type 2 diabetes mellitus without complications: Secondary | ICD-10-CM | POA: Insufficient documentation

## 2011-07-04 DIAGNOSIS — R51 Headache: Secondary | ICD-10-CM | POA: Insufficient documentation

## 2011-07-04 DIAGNOSIS — Z794 Long term (current) use of insulin: Secondary | ICD-10-CM | POA: Insufficient documentation

## 2011-07-04 DIAGNOSIS — H548 Legal blindness, as defined in USA: Secondary | ICD-10-CM | POA: Insufficient documentation

## 2011-07-04 HISTORY — DX: Legal blindness, as defined in USA: H54.8

## 2011-07-04 HISTORY — DX: Essential (primary) hypertension: I10

## 2011-07-04 LAB — COMPREHENSIVE METABOLIC PANEL
ALT: 15 U/L (ref 0–35)
AST: 17 U/L (ref 0–37)
Calcium: 9.7 mg/dL (ref 8.4–10.5)
GFR calc Af Amer: 66 mL/min — ABNORMAL LOW (ref >90)
Sodium: 134 mEq/L — ABNORMAL LOW (ref 135–145)
Total Protein: 7.9 g/dL (ref 6.0–8.3)

## 2011-07-04 LAB — LIPASE, BLOOD: Lipase: 29 U/L (ref 11–59)

## 2011-07-04 LAB — CBC
MCHC: 32.8 g/dL (ref 30.0–36.0)
RDW: 14.5 % (ref 11.5–15.5)

## 2011-07-04 LAB — GLUCOSE, CAPILLARY

## 2011-07-04 MED ORDER — ONDANSETRON HCL 4 MG PO TABS: 4.0000 mg | ORAL_TABLET | Freq: Four times a day (QID) | ORAL | Status: DC

## 2011-07-04 MED ORDER — SODIUM CHLORIDE 0.9 % IV BOLUS (SEPSIS)
500.0000 mL | Freq: Once | INTRAVENOUS | Status: AC
  Administered 2011-07-04: 500 mL via INTRAVENOUS

## 2011-07-04 MED ORDER — ONDANSETRON HCL 4 MG/2ML IJ SOLN
4.0000 mg | Freq: Once | INTRAMUSCULAR | Status: AC
  Administered 2011-07-04: 4 mg via INTRAVENOUS
  Filled 2011-07-04: qty 2

## 2011-07-04 MED ORDER — HYDROMORPHONE HCL PF 2 MG/ML IJ SOLN
INTRAMUSCULAR | Status: AC
  Administered 2011-07-04: 1 mg
  Filled 2011-07-04: qty 1

## 2011-07-04 MED ORDER — HYDROMORPHONE HCL PF 1 MG/ML IJ SOLN: 1.0000 mg | Freq: Once | INTRAMUSCULAR | Status: AC

## 2011-07-04 MED ORDER — OXYCODONE-ACETAMINOPHEN 5-325 MG PO TABS
2.0000 | ORAL_TABLET | Freq: Once | ORAL | Status: AC
  Administered 2011-07-04: 2 via ORAL
  Filled 2011-07-04: qty 2

## 2011-07-04 NOTE — ED Provider Notes (Signed)
History     CSN: [id number] Arrival date & time: 07/04/2011  8:39 AM   First MD Initiated Contact with Patient 07/04/11 [phone]      Chief Complaint  Patient presents with  . Headache  . Emesis    (Consider location/radiation/quality/duration/timing/severity/associated sxs/prior treatment) The history is provided by the patient.    Pt presents to the ED with nausea and headache  That started early this morning (3:00am).  Pt states that she has a surgically absent gallbladder and this is how she felt when she had her attack. 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 She states that the dry heaving has been severe and the course is waxing and waning. Upon my examination the patient is very uncomfortably.  Pt says that her and her friends were out drinking in [place] all weekend and she normally doesn't drink but ingested a lot of liquor and has not been rehydrating.   Past Medical History  Diagnosis Date  . Diabetes mellitus   . Legally blind   . Glaucoma   . Hypertension     Past Surgical History  Procedure Date  . Cholecystectomy   . Abdominal hysterectomy   . Cesarean section     No family history on file.  History  Substance Use Topics  . Smoking status: Former Games developer  . Smokeless tobacco: Not on file  . Alcohol Use: Yes     ocassionally    OB History    Grav Para Term Preterm Abortions TAB SAB Ect Mult Living                  Review of Systems  All other systems reviewed and are negative.    Allergies  Review of patient's allergies indicates no known allergies.  Home Medications   Current Outpatient Rx  Name Route Sig Dispense Refill  . BRIMONIDINE TARTRATE 0.1 % OP SOLN       . GLIMEPIRIDE 4 MG PO TABS Oral Take 4 mg by mouth daily before breakfast.      . INSULIN GLARGINE 100 UNIT/ML [place] SOLN Subcutaneous Inject 15 Units into the skin every morning. And 10 units at bedtime    . DAYQUIL MULTI-SYMPTOM PO Oral Take by mouth.      Marland Kitchen SIMVASTATIN 10 MG PO  TABS Oral Take 10 mg by mouth at bedtime.      Marland Kitchen VALSARTAN-HYDROCHLOROTHIAZIDE 160-12.5 MG PO TABS Oral Take 1 tablet by mouth daily.        BP 152/72  Pulse 114  Temp(Src) 99.1 F (37.3 C) (Oral)  Resp 20  Wt 250 lb (113.399 kg)  SpO2 99%  Physical Exam  Nursing note and vitals reviewed. Constitutional: She is oriented to person, place, and time. She appears well-developed and well-nourished.  HENT:  Head: Normocephalic and atraumatic.  Eyes: Conjunctivae are normal. Pupils are equal, round, and reactive to light.    Neck: Trachea normal, normal range of motion and full passive range of motion without pain. Neck supple.  Cardiovascular: Normal rate, regular rhythm and normal pulses.   Pulmonary/Chest: Effort normal and breath sounds normal. Chest wall is not dull to percussion. She exhibits no tenderness, no crepitus, no edema, no deformity and no retraction.  Abdominal: Soft. Normal appearance and bowel sounds are normal.  Musculoskeletal: Normal range of motion.  Neurological: She is oriented to person, place, and time. She has normal strength.  Skin: Skin is warm, dry and intact.  Psychiatric: She has a normal mood and  affect. Her speech is normal and behavior is normal. Judgment and thought content normal. Cognition and memory are normal.    ED Course  Procedures (including critical care time)  Labs Reviewed  GLUCOSE, CAPILLARY - Abnormal; Notable for the following:    Glucose-Capillary 194 (*)    All other components within normal limits  CBC - Abnormal; Notable for the following:    Hemoglobin 10.3 (*)    HCT 31.4 (*)    MCV 77.1 (*)    MCH 25.3 (*)    All other components within normal limits  COMPREHENSIVE METABOLIC PANEL - Abnormal; Notable for the following:    Sodium 134 (*)    Glucose, Bld 198 (*)    Creatinine, Ser 1.15 (*)    GFR calc non Af Amer 57 (*)    GFR calc Af Amer 66 (*)    All other components within normal limits  LIPASE, BLOOD  POCT CBG  MONITORING   No results found.   No diagnosis found.    MDM  After Zofran and 1mg  Dilaudid the patient is resting comfortably in bed and denies any nausea or headache. She says that the headache was like her normal headache and unlike her glaucoma. She has been compliant with her eyedrops.        Dorthula Matas, PA 07/04/11 1205

## 2011-07-04 NOTE — ED Notes (Signed)
Patient is resting comfortably. 

## 2011-07-04 NOTE — ED Provider Notes (Signed)
Medical screening examination/treatment/procedure(s) were performed by non-physician practitioner and as supervising physician I was immediately available for consultation/collaboration.    Zaul Hubers L Hayat Warbington, MD 07/04/11 1812 

## 2011-07-04 NOTE — ED Notes (Signed)
Kirsten Shaffer [phone] Kirsten Shaffer [phone] Kirsten Shaffer [phone]

## 2011-07-04 NOTE — ED Notes (Signed)
Pt states "I have been vomiting & h/a since 0300, yesterday coughing, c/p, started visually impaired left eye & less than 50% right, am a diabetic, haven't taken my BS this morning"; pt denies productive cough

## 2011-07-04 NOTE — ED Notes (Signed)
Pt resting in bed, ABC's intact, no distress noted, fluid bolus infusing

## 2011-07-07 ENCOUNTER — Other Ambulatory Visit: Payer: Self-pay

## 2011-07-07 ENCOUNTER — Encounter (HOSPITAL_COMMUNITY): Payer: Self-pay | Admitting: Emergency Medicine

## 2011-07-07 ENCOUNTER — Emergency Department (HOSPITAL_COMMUNITY): Payer: Medicare Other

## 2011-07-07 ENCOUNTER — Emergency Department (HOSPITAL_COMMUNITY)
Admission: EM | Admit: 2011-07-07 | Discharge: 2011-07-07 | Disposition: A | Discharge status: Home / Self Care | Payer: Medicare Other | Attending: Emergency Medicine | Admitting: Emergency Medicine

## 2011-07-07 DIAGNOSIS — Z794 Long term (current) use of insulin: Secondary | ICD-10-CM | POA: Insufficient documentation

## 2011-07-07 DIAGNOSIS — E119 Type 2 diabetes mellitus without complications: Secondary | ICD-10-CM | POA: Insufficient documentation

## 2011-07-07 DIAGNOSIS — R062 Wheezing: Secondary | ICD-10-CM | POA: Insufficient documentation

## 2011-07-07 DIAGNOSIS — H548 Legal blindness, as defined in USA: Secondary | ICD-10-CM | POA: Insufficient documentation

## 2011-07-07 DIAGNOSIS — I10 Essential (primary) hypertension: Secondary | ICD-10-CM | POA: Insufficient documentation

## 2011-07-07 DIAGNOSIS — J3489 Other specified disorders of nose and nasal sinuses: Secondary | ICD-10-CM | POA: Insufficient documentation

## 2011-07-07 DIAGNOSIS — R0602 Shortness of breath: Secondary | ICD-10-CM | POA: Insufficient documentation

## 2011-07-07 DIAGNOSIS — J40 Bronchitis, not specified as acute or chronic: Secondary | ICD-10-CM | POA: Insufficient documentation

## 2011-07-07 HISTORY — DX: Pure hypercholesterolemia, unspecified: E78.00

## 2011-07-07 MED ORDER — ALBUTEROL SULFATE (5 MG/ML) 0.5% IN NEBU: 2.5000 mg | INHALATION_SOLUTION | RESPIRATORY_TRACT | Status: DC

## 2011-07-07 MED ORDER — ALBUTEROL SULFATE HFA 108 (90 BASE) MCG/ACT IN AERS
2.0000 | INHALATION_SPRAY | Freq: Once | RESPIRATORY_TRACT | Status: AC
  Administered 2011-07-07: 2 via RESPIRATORY_TRACT

## 2011-07-07 MED ORDER — KETOROLAC TROMETHAMINE 30 MG/ML IJ SOLN
30.0000 mg | Freq: Once | INTRAMUSCULAR | Status: AC
  Administered 2011-07-07: 30 mg via INTRAMUSCULAR
  Filled 2011-07-07: qty 1

## 2011-07-07 MED ORDER — ALBUTEROL SULFATE HFA 108 (90 BASE) MCG/ACT IN AERS: 2.0000 | INHALATION_SPRAY | RESPIRATORY_TRACT | Status: DC | PRN

## 2011-07-07 MED ORDER — ALBUTEROL SULFATE HFA 108 (90 BASE) MCG/ACT IN AERS
INHALATION_SPRAY | RESPIRATORY_TRACT | Status: AC
  Administered 2011-07-07: 16:00:00
  Filled 2011-07-07: qty 6.7

## 2011-07-07 MED ORDER — ONDANSETRON 4 MG PO TBDP
4.0000 mg | ORAL_TABLET | Freq: Once | ORAL | Status: AC
  Administered 2011-07-07: 4 mg via ORAL
  Filled 2011-07-07: qty 1

## 2011-07-07 MED ORDER — IPRATROPIUM BROMIDE 0.02 % IN SOLN: 0.5000 mg | RESPIRATORY_TRACT | Status: DC

## 2011-07-07 MED ORDER — AZITHROMYCIN 250 MG PO TABS: 250.0000 mg | ORAL_TABLET | Freq: Every day | ORAL | Status: AC

## 2011-07-07 NOTE — ED Provider Notes (Signed)
History     CSN: [id number] Arrival date & time: 07/07/2011 11:19 AM   First MD Initiated Contact with Patient 07/07/11 1529      Chief Complaint  Patient presents with  . Influenza  . Chest Pain  . Sore Throat     HPI  45yoF h/o HTN, HLD, IDDM pw multiple complaints. C/O 4 days of body aches, sore throat, headache, nasal congestion/rhinorrhea, vomiting. Seen here 4 days ago for same- felt better with supportive care then went home. Continues to have nausea, without vomiting. C/O worsening cough and chest soreness which is mainly on Rt chest and worse with inspiration, movement, and breathing. Denies fever. Had sweating last night. Denies chills. Denies abdominal pain, back pain.  Denies hematuria/dysuria/freq/urgency. +sick contacts- her son has same symptoms and is here in ED being evaluated as well  ED Notes, ED Provider Notes from 07/07/11 0000 to 07/07/11 11:49:05       Rachel A. Dot Lanes, RN 07/07/2011 11:37      Pt states she has had flu like symptoms since Tuesday. She has had a cough, sore throat, congestion, HA, N/V, and chest pain worse upon inspiration.     Past Medical History  Diagnosis Date  . Diabetes mellitus   . Legally blind   . Glaucoma   . Hypertension   . Hypercholesterolemia     Past Surgical History  Procedure Date  . Cholecystectomy   . Abdominal hysterectomy   . Cesarean section     No family history on file.  History  Substance Use Topics  . Smoking status: Former Games developer  . Smokeless tobacco: Not on file  . Alcohol Use: Yes     ocassionally    OB History    Grav Para Term Preterm Abortions TAB SAB Ect Mult Living                  Review of Systems  All other systems reviewed and are negative.   except as noted HPI   Allergies  Review of patient's allergies indicates no known allergies.  Home Medications   Current Outpatient Rx  Name Route Sig Dispense Refill  . BRIMONIDINE TARTRATE 0.1 % OP SOLN Both Eyes Place 1 drop into  both eyes 2 (two) times daily. Pt uses for pressure    . GLIMEPIRIDE 4 MG PO TABS Oral Take 4 mg by mouth daily before breakfast.      . INSULIN GLARGINE 100 UNIT/ML [place] SOLN Subcutaneous Inject 15 Units into the skin every morning. And 10 units at bedtime    . ONDANSETRON HCL 4 MG PO TABS Oral Take 4 mg by mouth every 6 (six) hours. nausea     . DAYQUIL MULTI-SYMPTOM PO Oral Take 1 tablet by mouth. Cold symptoms    . SIMVASTATIN 10 MG PO TABS Oral Take 10 mg by mouth at bedtime.      Marland Kitchen VALSARTAN-HYDROCHLOROTHIAZIDE 160-12.5 MG PO TABS Oral Take 1 tablet by mouth daily.        BP 145/79  Pulse 84  Temp(Src) 97.9 F (36.6 C) (Oral)  Resp 20  SpO2 98%  Physical Exam  Nursing note and vitals reviewed. Constitutional: She is oriented to person, place, and time. She appears well-developed.  HENT:  Head: Atraumatic.  Mouth/Throat: Oropharynx is clear and moist. No oropharyngeal exudate.       +nasal congestion Mild posterior OP erythema no exudates Uvula midline  Eyes: Conjunctivae and EOM are normal. Pupils are equal, round, and reactive  to light.  Neck: Normal range of motion. Neck supple.  Cardiovascular: Normal rate, regular rhythm, normal heart sounds and intact distal pulses.   Pulmonary/Chest: Effort normal. No respiratory distress. She has wheezes. She has no rales.       Diffuse exp wheeze  Abdominal: Soft. She exhibits no distension. There is no tenderness. There is no rebound and no guarding.  Musculoskeletal: Normal range of motion.  Neurological: She is alert and oriented to person, place, and time.  Skin: Skin is warm and dry. No rash noted.  Psychiatric: She has a normal mood and affect.    ED Course  Procedures (including critical care time)  Labs Reviewed  GLUCOSE, CAPILLARY - Abnormal; Notable for the following:    Glucose-Capillary 180 (*)    All other components within normal limits  RAPID STREP SCREEN  POCT CBG MONITORING  STREP A DNA PROBE   Dg Chest 2  View  07/07/2011  *RADIOLOGY REPORT*  Clinical Data: Cough and shortness of breath.  Hypertension and diabetes.  CHEST - 2 VIEW  Comparison: 11/21/2009  Findings: Heart size is within normal limits.  Central peribronchial thickening is again demonstrated.  No evidence of acute infiltrate or pulmonary edema.  No evidence of pleural effusion.  No mass or lymphadenopathy identified.  IMPRESSION: Stable exam.  No active disease.  Original Report Authenticated By: Danae Orleans, M.D.     1. Bronchitis       MDM  Flu like symptoms, wheezing/URI. Likely bronchitis. Will give zofran odt, toradol inj, duoneb. Anticipate Home with z pack, albuterol inh.    4:55 PM  Feeling better, ready to go home. Precautions for return.       Forbes Cellar, MD 07/07/11 1655

## 2011-07-07 NOTE — ED Notes (Signed)
Patient transported to X-ray 

## 2011-07-07 NOTE — ED Notes (Signed)
Pt states she has had flu like symptoms since Tuesday.  She has had a cough, sore throat, congestion, HA, N/V, and chest pain worse upon inspiration.

## 2011-07-08 LAB — STREP A DNA PROBE
Group A Strep Probe: NEGATIVE
Special Requests: NORMAL

## 2012-04-09 ENCOUNTER — Encounter (HOSPITAL_COMMUNITY): Payer: Self-pay | Admitting: *Deleted

## 2012-04-09 ENCOUNTER — Emergency Department (INDEPENDENT_AMBULATORY_CARE_PROVIDER_SITE_OTHER)
Admission: EM | Admit: 2012-04-09 | Discharge: 2012-04-09 | Disposition: A | Discharge status: Home / Self Care | Payer: Medicare Other | Source: Home / Self Care

## 2012-04-09 ENCOUNTER — Emergency Department (INDEPENDENT_AMBULATORY_CARE_PROVIDER_SITE_OTHER): Payer: Medicare Other

## 2012-04-09 DIAGNOSIS — S40029A Contusion of unspecified upper arm, initial encounter: Secondary | ICD-10-CM

## 2012-04-09 DIAGNOSIS — S6000XA Contusion of unspecified finger without damage to nail, initial encounter: Secondary | ICD-10-CM
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
## 2012-04-09 NOTE — ED Notes (Signed)
l  Hand  Splint  With  webroll  Ace  Bandage  In POF
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
## 2012-04-09 NOTE — ED Provider Notes (Signed)
Medical screening examination/treatment/procedure(s) were performed by non-physician practitioner and as supervising physician I was immediately available for consultation/collaboration.  Leslee Home, M.D.   Reuben Likes, MD 04/09/12 [phone]

## 2012-05-01 ENCOUNTER — Encounter (INDEPENDENT_AMBULATORY_CARE_PROVIDER_SITE_OTHER): Payer: Self-pay | Admitting: General Surgery

## 2012-05-08 ENCOUNTER — Other Ambulatory Visit: Payer: Self-pay | Admitting: Obstetrics and Gynecology

## 2012-05-24 ENCOUNTER — Ambulatory Visit (INDEPENDENT_AMBULATORY_CARE_PROVIDER_SITE_OTHER): Payer: Medicare PPO | Admitting: General Surgery

## 2012-05-24 ENCOUNTER — Encounter (INDEPENDENT_AMBULATORY_CARE_PROVIDER_SITE_OTHER): Payer: Self-pay | Admitting: General Surgery

## 2012-05-24 VITALS — BP 128/80 | HR 100 | Temp 97.6°F | Resp 16 | Ht 66.5 in | Wt 250.0 lb

## 2012-05-24 DIAGNOSIS — D1779 Benign lipomatous neoplasm of other sites: Secondary | ICD-10-CM

## 2012-05-24 DIAGNOSIS — D171 Benign lipomatous neoplasm of skin and subcutaneous tissue of trunk: Secondary | ICD-10-CM | POA: Insufficient documentation

## 2012-05-24 NOTE — Progress Notes (Signed)
Patient ID: Kirsten Shaffer, female   DOB: 05-11-1966, 46 y.o.   MRN: [id number]  Chief Complaint  Patient presents with  . Mass    mid-back    HPI Tarea Kirsten Shaffer is a 46 y.o. female.   HPI 46 yo female referred by Dr Rana Snare for evaluation of a back lipoma. The patient states that she first noticed the area around 2006. She states over the past several years it has slowly increased in size. She denies any trauma to the area. She denies any drainage or redness from the area. She states that it interferes with her wearing a bra. She also states that it just causes some generalized irritation when her bra lays across the top of it. She denies any adenopathy. She denies any other soft tissue masses. She denies any family history of sarcoma. She denies any fever, chills, weight loss, night sweats. Past Medical History  Diagnosis Date  . Diabetes mellitus   . Legally blind   . Glaucoma(365)   . Hypertension   . Hypercholesterolemia     Past Surgical History  Procedure Date  . Cholecystectomy   . Abdominal hysterectomy   . Cesarean section     No family history on file.  Social History History  Substance Use Topics  . Smoking status: Former Games developer  . Smokeless tobacco: Not on file  . Alcohol Use: Yes     ocassionally    No Known Allergies  Current Outpatient Prescriptions  Medication Sig Dispense Refill  . albuterol (PROVENTIL HFA;VENTOLIN HFA) 108 (90 BASE) MCG/ACT inhaler Inhale 2 puffs into the lungs every 4 (four) hours as needed for wheezing.  1 Inhaler  0  . brimonidine (ALPHAGAN P) 0.1 % SOLN Place 1 drop into both eyes 2 (two) times daily. Pt uses for pressure      . Chlorpheniramine Maleate ([employer] ALLERGY PO) Take by mouth as needed.      Marland Kitchen glimepiride (AMARYL) 4 MG tablet Take 4 mg by mouth daily before breakfast.        . insulin glargine (LANTUS) 100 UNIT/ML injection Inject 15 Units into the skin every morning. And 10 units at bedtime      . Multiple Vitamin  (MULTIVITAMIN) tablet Take 1 tablet by mouth daily.      . simvastatin (ZOCOR) 10 MG tablet Take 10 mg by mouth at bedtime.        . valsartan-hydrochlorothiazide (DIOVAN-HCT) 160-12.5 MG per tablet Take 1 tablet by mouth daily.          Review of Systems Review of Systems  Constitutional: Negative for activity change and appetite change.  HENT: Negative for hearing loss and neck pain.   Eyes:       Legally blind  Respiratory: Negative for chest tightness, shortness of breath and wheezing.        Thinks she may have sleep apnea  Cardiovascular: Negative for chest pain and palpitations.       Denies cp, sob, orthopnea, PND  Gastrointestinal: Negative for abdominal pain.  Genitourinary: Negative for hematuria and difficulty urinating.  Neurological: Negative for tremors, seizures, speech difficulty and weakness.  Hematological: Negative for adenopathy. Does not bruise/bleed easily.  Psychiatric/Behavioral: Negative for suicidal ideas.    Blood pressure 128/80, pulse 100, temperature 97.6 F (36.4 C), temperature source Temporal, resp. rate 16, height 5' 6.5" (1.689 m), weight 250 lb (113.399 kg).  Physical Exam Physical Exam  Vitals reviewed. Constitutional: She is oriented to person, place, and time.  She appears well-developed and well-nourished. No distress.  HENT:  Head: Normocephalic and atraumatic.  Right Ear: External ear normal.  Left Ear: External ear normal.  Eyes: Conjunctivae normal are normal. No scleral icterus.  Neck: Normal range of motion. Neck supple.  Cardiovascular: Normal rate, regular rhythm and normal heart sounds.   Pulmonary/Chest: Effort normal and breath sounds normal. No respiratory distress. She has no wheezes. She has no rales.  Abdominal: Soft. She exhibits no distension.  Musculoskeletal: She exhibits no edema and no tenderness.  Lymphadenopathy:    She has no cervical adenopathy.    She has no axillary adenopathy.       Right: No supraclavicular  adenopathy present.       Left: No supraclavicular adenopathy present.  Neurological: She is alert and oriented to person, place, and time. She exhibits normal muscle tone.  Skin: Skin is warm and dry. No rash noted. She is not diaphoretic. No erythema. No pallor.          6 x 6cm right mid back subcu mass just to right of spine, soft, well-circumscribed. Mobile. No overlying skin changes  Psychiatric: She has a normal mood and affect. Her behavior is normal. Judgment and thought content normal.    Data Reviewed Dr Octavio Graves note from 03/2012 ED note from 10/13  Assessment    Upper back subcutaneous mass c/w lipoma    Plan    We discussed the etiology and management of lipomas. The patient was given educational material. We discussed that the majority of lipomas are benign although on a rare occasion it can be malignant.   We discussed observation versus surgical excision. We discussed the risks and benefits of surgery including but not limited to bleeding, infection, injury to surrounding structures, scarring, cosmetic concerns, possible temporary drain placement, blood clot formation, anesthesia issues, possible recurrence, and the typical postoperative course.   The patient has elected to proceed with EXCISION OF UPPER BACK SUBCUTANEOUS MASS/LIPOMA  She would like to have it done at [facility].  Mary Sella. Andrey Campanile, MD, FACS General, Bariatric, & Minimally Invasive Surgery [facility], [place]         [facility] M 05/24/2012, 3:28 PM

## 2012-05-24 NOTE — Patient Instructions (Signed)
Lipoma  A lipoma is a noncancerous (benign) tumor composed of fat cells. They are usually found under the skin (subcutaneous). A lipoma may occur in any tissue of the body that contains fat. Common areas for lipomas to appear include the back, shoulders, buttocks, and thighs. Lipomas are a very common soft tissue growth. They are soft and grow slowly. Most problems caused by a lipoma depend on where it is growing.  DIAGNOSIS   A lipoma can be diagnosed with a physical exam. These tumors rarely become cancerous, but radiographic studies can help determine this for certain. Studies used may include:   Computerized X-ray scans (CT or CAT scan).   Computerized magnetic scans (MRI).  TREATMENT   Small lipomas that are not causing problems may be watched. If a lipoma continues to enlarge or causes problems, removal is often the best treatment. Lipomas can also be removed to improve appearance. Surgery is done to remove the fatty cells and the surrounding capsule. Most often, this is done with medicine that numbs the area (local anesthetic). The removed tissue is examined under a microscope to make sure it is not cancerous. Keep all follow-up appointments with your caregiver.  SEEK MEDICAL CARE IF:    The lipoma becomes larger or hard.   The lipoma becomes painful, red, or increasingly swollen. These could be signs of infection or a more serious condition.  Document Released: 07/07/2002 Document Revised: 10/09/2011 Document Reviewed: 12/17/2009  [employer] Patient Information 2013 [employer].

## 2012-05-28 ENCOUNTER — Telehealth (INDEPENDENT_AMBULATORY_CARE_PROVIDER_SITE_OTHER): Payer: Self-pay | Admitting: General Surgery

## 2012-05-28 NOTE — Telephone Encounter (Signed)
[facility] making patient aware of post op appt.

## 2012-05-29 ENCOUNTER — Other Ambulatory Visit: Payer: Self-pay | Admitting: Obstetrics and Gynecology

## 2012-06-19 ENCOUNTER — Encounter (INDEPENDENT_AMBULATORY_CARE_PROVIDER_SITE_OTHER): Payer: Medicare PPO | Admitting: General Surgery

## 2012-07-15 ENCOUNTER — Encounter (HOSPITAL_COMMUNITY): Payer: Self-pay | Admitting: *Deleted

## 2012-07-15 ENCOUNTER — Emergency Department (INDEPENDENT_AMBULATORY_CARE_PROVIDER_SITE_OTHER): Payer: Medicare Other

## 2012-07-15 ENCOUNTER — Emergency Department (INDEPENDENT_AMBULATORY_CARE_PROVIDER_SITE_OTHER)
Admission: EM | Admit: 2012-07-15 | Discharge: 2012-07-15 | Disposition: A | Discharge status: Home / Self Care | Payer: Medicare Other | Source: Home / Self Care | Attending: Emergency Medicine | Admitting: Emergency Medicine

## 2012-07-15 DIAGNOSIS — J209 Acute bronchitis, unspecified: Secondary | ICD-10-CM

## 2012-07-15 DIAGNOSIS — R0789 Other chest pain: Secondary | ICD-10-CM

## 2012-07-15 HISTORY — DX: Sickle-cell trait: D57.3

## 2012-07-15 MED ORDER — ALBUTEROL SULFATE HFA 108 (90 BASE) MCG/ACT IN AERS
1.0000 | INHALATION_SPRAY | Freq: Four times a day (QID) | RESPIRATORY_TRACT | Status: DC | PRN
Start: 2012-07-15 — End: 2013-11-09

## 2012-07-15 MED ORDER — AEROCHAMBER PLUS FLO-VU LARGE MISC: 1.0000 | Freq: Once | Status: DC

## 2012-07-15 MED ORDER — AZITHROMYCIN 250 MG PO TABS: ORAL_TABLET | ORAL | Status: DC

## 2012-07-15 MED ORDER — HYDROCOD POLST-CHLORPHEN POLST 10-8 MG/5ML PO LQCR: 5.0000 mL | Freq: Two times a day (BID) | ORAL | Status: DC | PRN

## 2012-07-15 MED ORDER — NAPROXEN 500 MG PO TABS: 500.0000 mg | ORAL_TABLET | Freq: Two times a day (BID) | ORAL | Status: DC

## 2012-07-15 NOTE — ED Notes (Signed)
C/o SOB and pain in her lungs onset yesterday.  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  Sore on palpation of her R lower ant. Ribs.  Had sinus cold 4 days ago with cough, sore throat and nasal congestion.  Cough has decreased and sore-throat is gone.

## 2012-07-15 NOTE — ED Provider Notes (Signed)
Chief Complaint  Patient presents with  . Shortness of Breath    History of Present Illness:   The patient is a 46 year old female with diabetes, hypertension, hypercholesterolemia, and glaucoma who presents with a two-day history of shortness of breath and left submammary chest pain. She denies any pleuritic pain. The pain is worse with palpation, coughing, twisting, bending, and movement. The patient had an upper respiratory infection last week with fever, nasal congestion, drainage, and sore throat. These symptoms are all gone away but she's been left with a cough productive of small amounts of green sputum, chest tightness and heaviness, and she has a history of asthma. She's felt slightly nauseated her appetite has been poor. She denies any current fever or chills. She's had no wheezing. She denies any diaphoresis or abdominal pain. There's been no vomiting.  Review of Systems:  Other than noted above, the patient denies any of the following symptoms. Systemic:  No fever, chills, sweats, or fatigue. ENT:  No nasal congestion, rhinorrhea, or sore throat. Pulmonary:  No cough, wheezing, shortness of breath, sputum production, hemoptysis. Cardiac:  No palpitations, rapid heartbeat, dizziness, presyncope or syncope. GI:  No abdominal pain, heartburn, nausea, or vomiting. Ext:  No leg pain or swelling.  PMFSH:  Past medical history, family history, social history, meds, and allergies were reviewed and updated as needed.   Physical Exam:   Vital signs:  BP 156/89  Pulse 92  Temp 97 F (36.1 C) (Oral)  Resp 20  SpO2 99% Gen:  Alert, oriented, in no distress, skin warm and dry. Eye:  PERRL, lids and conjunctivas normal.  Sclera non-icteric. ENT:  Mucous membranes moist, pharynx clear. Neck:  Supple, no adenopathy or tenderness.  No JVD. Lungs:  Clear to auscultation, no wheezes, rales or rhonchi.  No respiratory distress. Heart:  Regular rhythm.  No gallops, murmers, clicks or rubs. Chest:   She has moderate tenderness to palpation in the right submammary area. Abdomen:  Soft, nontender, no organomegaly or mass.  Bowel sounds normal.  No pulsatile abdominal mass or bruit. Ext:  No edema.  No calf tenderness and Homann's sign negative.  Pulses full and equal. Skin:  Warm and dry.  No rash.  Radiology:  Dg Chest 2 View  07/15/2012  *RADIOLOGY REPORT*  Clinical Data: Left chest pain.  CHEST - 2 VIEW  Comparison: 07/07/2011.  Findings: Normal sized heart.  Clear lungs.  Thoracic spine degenerative changes.  IMPRESSION: No acute abnormality.   Original Report Authenticated By: Beckie Salts, M.D.    I reviewed the images independently and personally and concur with the radiologist's findings.  EKG:   Date: 07/15/2012  Rate: 83  Rhythm: normal sinus rhythm  QRS Axis: normal  Intervals: normal  ST/T Wave abnormalities: normal  Conduction Disutrbances:none  Narrative Interpretation: Normal sinus rhythm, normal EKG  Old EKG Reviewed: none available  Assessment:  The primary encounter diagnosis was Acute bronchitis. A diagnosis of Muscular chest pain was also pertinent to this visit.  Plan:   1.  The following meds were prescribed:   New Prescriptions   ALBUTEROL (PROVENTIL HFA;VENTOLIN HFA) 108 (90 BASE) MCG/ACT INHALER    Inhale 1-2 puffs into the lungs every 6 (six) hours as needed for wheezing.   AZITHROMYCIN (ZITHROMAX Z-PAK) 250 MG TABLET    Take as directed.   CHLORPHENIRAMINE-HYDROCODONE (TUSSIONEX) 10-8 MG/5ML LQCR    Take 5 mLs by mouth every 12 (twelve) hours as needed.   NAPROXEN (NAPROSYN) 500 MG TABLET  Take 1 tablet (500 mg total) by mouth 2 (two) times daily.   SPACER/AERO-HOLDING CHAMBERS (AEROCHAMBER PLUS FLO-VU LARGE) MISC    1 each by Other route once.   2.  The patient was instructed in symptomatic care and handouts were given. 3.  The patient was told to return if becoming worse in any way, if no better in 3 or 4 days, and given some red flag symptoms that  would indicate earlier return.    Reuben Likes, MD 07/15/12 [id number]

## 2012-09-16 ENCOUNTER — Emergency Department (HOSPITAL_COMMUNITY)
Admission: EM | Admit: 2012-09-16 | Discharge: 2012-09-16 | Disposition: A | Discharge status: Home / Self Care | Payer: Medicare HMO | Attending: Emergency Medicine | Admitting: Emergency Medicine

## 2012-09-16 ENCOUNTER — Encounter (HOSPITAL_COMMUNITY): Payer: Self-pay

## 2012-09-16 DIAGNOSIS — R112 Nausea with vomiting, unspecified: Secondary | ICD-10-CM | POA: Insufficient documentation

## 2012-09-16 DIAGNOSIS — E7800 Pure hypercholesterolemia, unspecified: Secondary | ICD-10-CM | POA: Insufficient documentation

## 2012-09-16 DIAGNOSIS — IMO0001 Reserved for inherently not codable concepts without codable children: Secondary | ICD-10-CM | POA: Insufficient documentation

## 2012-09-16 DIAGNOSIS — H548 Legal blindness, as defined in USA: Secondary | ICD-10-CM | POA: Insufficient documentation

## 2012-09-16 DIAGNOSIS — Z9849 Cataract extraction status, unspecified eye: Secondary | ICD-10-CM | POA: Insufficient documentation

## 2012-09-16 DIAGNOSIS — Z79899 Other long term (current) drug therapy: Secondary | ICD-10-CM | POA: Insufficient documentation

## 2012-09-16 DIAGNOSIS — Z791 Long term (current) use of non-steroidal anti-inflammatories (NSAID): Secondary | ICD-10-CM | POA: Insufficient documentation

## 2012-09-16 DIAGNOSIS — Z87891 Personal history of nicotine dependence: Secondary | ICD-10-CM | POA: Insufficient documentation

## 2012-09-16 DIAGNOSIS — Z9089 Acquired absence of other organs: Secondary | ICD-10-CM | POA: Insufficient documentation

## 2012-09-16 DIAGNOSIS — D573 Sickle-cell trait: Secondary | ICD-10-CM | POA: Insufficient documentation

## 2012-09-16 DIAGNOSIS — R197 Diarrhea, unspecified: Secondary | ICD-10-CM | POA: Insufficient documentation

## 2012-09-16 DIAGNOSIS — H409 Unspecified glaucoma: Secondary | ICD-10-CM | POA: Insufficient documentation

## 2012-09-16 DIAGNOSIS — I10 Essential (primary) hypertension: Secondary | ICD-10-CM | POA: Insufficient documentation

## 2012-09-16 DIAGNOSIS — Z90710 Acquired absence of both cervix and uterus: Secondary | ICD-10-CM | POA: Insufficient documentation

## 2012-09-16 DIAGNOSIS — R109 Unspecified abdominal pain: Secondary | ICD-10-CM | POA: Insufficient documentation

## 2012-09-16 LAB — GLUCOSE, CAPILLARY: Glucose-Capillary: 217 mg/dL — ABNORMAL HIGH (ref 70–99)

## 2012-09-16 LAB — CBC WITH DIFFERENTIAL/PLATELET
Basophils Absolute: 0 10*3/uL (ref 0.0–0.1)
HCT: 33.3 % — ABNORMAL LOW (ref 36.0–46.0)
Lymphocytes Relative: 28 % (ref 12–46)
Neutro Abs: 5.3 10*3/uL (ref 1.7–7.7)
Platelets: 263 10*3/uL (ref 150–400)
RBC: 4.15 MIL/uL (ref 3.87–5.11)
RDW: 15 % (ref 11.5–15.5)
WBC: 8.8 10*3/uL (ref 4.0–10.5)

## 2012-09-16 LAB — BASIC METABOLIC PANEL
CO2: 16 mEq/L — ABNORMAL LOW (ref 19–32)
Chloride: 103 mEq/L (ref 96–112)
Sodium: 133 mEq/L — ABNORMAL LOW (ref 135–145)

## 2012-09-16 MED ORDER — METOCLOPRAMIDE HCL 10 MG PO TABS: 10.0000 mg | ORAL_TABLET | Freq: Four times a day (QID) | ORAL | Status: DC

## 2012-09-16 MED ORDER — ONDANSETRON HCL 4 MG/2ML IJ SOLN: 4.0000 mg | Freq: Once | INTRAMUSCULAR | Status: DC

## 2012-09-16 MED ORDER — METOCLOPRAMIDE HCL 5 MG/ML IJ SOLN
10.0000 mg | Freq: Once | INTRAMUSCULAR | Status: AC
  Administered 2012-09-16: 10 mg via INTRAVENOUS
  Filled 2012-09-16: qty 2

## 2012-09-16 MED ORDER — ONDANSETRON HCL 4 MG/2ML IJ SOLN
4.0000 mg | Freq: Once | INTRAMUSCULAR | Status: AC
  Administered 2012-09-16: 4 mg via INTRAVENOUS
  Filled 2012-09-16: qty 2

## 2012-09-16 MED ORDER — DICYCLOMINE HCL 20 MG PO TABS: 20.0000 mg | ORAL_TABLET | Freq: Two times a day (BID) | ORAL | Status: DC

## 2012-09-16 MED ORDER — SODIUM CHLORIDE 0.9 % IV BOLUS (SEPSIS)
1000.0000 mL | INTRAVENOUS | Status: AC
  Administered 2012-09-16: 1000 mL via INTRAVENOUS

## 2012-09-16 MED ORDER — SODIUM CHLORIDE 0.9 % IV SOLN
Freq: Once | INTRAVENOUS | Status: AC
  Administered 2012-09-16: 22:00:00 via INTRAVENOUS

## 2012-09-16 MED ORDER — DICYCLOMINE HCL 10 MG/ML IM SOLN
20.0000 mg | Freq: Once | INTRAMUSCULAR | Status: AC
  Administered 2012-09-16: 20 mg via INTRAMUSCULAR
  Filled 2012-09-16: qty 2

## 2012-09-16 NOTE — ED Provider Notes (Signed)
Pt seen by Roxy Horseman, PA-C and handed off to me at end of shift.   She comes in for N,V,D and sugars  At 200. She has a PCP Dr Concepcion Elk. Her labs are not acute and we are currently trying to control her nausea and vomiting. SHe has had multiple episodes of diarrhea in the ED. She denies abd pain. Has been given 2 L fluids and 2 rounds of 4mg  IV Zofran for nausea. She says that they have not worked.  Pt given Reglan and Bentyl in ED. She said that her nausea and cramping has now resolved. Passed fluid challenge. Advised to follow up with doctor tomorrow.     Rx phenergan and bentyl  Pt has been advised of the symptoms that warrant their return to the ED. Patient has voiced understanding and has agreed to follow-up with the PCP or specialist.   Dorthula Matas, PA 09/16/12 2210

## 2012-09-16 NOTE — ED Provider Notes (Signed)
Medical screening examination/treatment/procedure(s) were performed by non-physician practitioner and as supervising physician I was immediately available for consultation/collaboration.   Elnore Cosens L Duke Weisensel, MD 09/16/12 2352 

## 2012-09-16 NOTE — ED Provider Notes (Signed)
Medical screening examination/treatment/procedure(s) were performed by non-physician practitioner and as supervising physician I was immediately available for consultation/collaboration.   Viral Schramm L Juanda Luba, MD 09/16/12 2352 

## 2012-09-16 NOTE — ED Notes (Addendum)
Pt states she has had diarrhea since Thursday. Pt has taken Immodium, but it is not helping. Pt also reports having vomiting last night and this morning. Pt also reports that she hasn't had any of her meds since Thursday. Pt a/o x 3. Family at bedside.

## 2012-09-16 NOTE — ED Notes (Signed)
Pt taking sips of ice water--- pt having dry heaves, states she is unable to "drink" at this time.

## 2012-09-16 NOTE — ED Notes (Signed)
Patient states that she has had intermittent N/V/D, hyperglycemia x 2 weeks.

## 2012-09-16 NOTE — ED Provider Notes (Signed)
History     CSN: [id number]  Arrival date & time 09/16/12  1447   First MD Initiated Contact with Patient 09/16/12 1646      Chief Complaint  Patient presents with  . Diarrhea  . Hyperglycemia  . Emesis    (Consider location/radiation/quality/duration/timing/severity/associated sxs/prior treatment) HPI Comments: 47 year old female, who presents emergency department with chief complaint of nausea, vomiting, and diarrhea. Patient states that she's been feeling sick since Thursday. She has had 5-6 episodes diarrhea per day, and has had vomiting anytime she started eating. She denies any blood in her vomit or in her stool. She has tried taking Imodium with no relief. States that she has not been taking her other medications, because she cannot keep them down. She also complains of date abdominal pain, which she believes is due to to retching from vomiting. States that her level of discomfort is moderate.  The history is provided by the patient. No language interpreter was used.    Past Medical History  Diagnosis Date  . Diabetes mellitus   . Legally blind   . Glaucoma(365)   . Hypertension   . Hypercholesterolemia   . Sickle cell trait     Past Surgical History  Procedure Laterality Date  . Cholecystectomy    . Abdominal hysterectomy    . Cesarean section    . Cataract extraction, bilateral    . Stent eye    . Retinal detachment surgery      bilaterally    Family History  Problem Relation Age of Onset  . Diabetes Mother   . Heart failure Mother     History  Substance Use Topics  . Smoking status: Former Games developer  . Smokeless tobacco: Not on file  . Alcohol Use: Yes     Comment: ocassionally    OB History   Grav Para Term Preterm Abortions TAB SAB Ect Mult Living                  Review of Systems  All other systems reviewed and are negative.    Allergies  Review of patient's allergies indicates no known allergies.  Home Medications   Current  Outpatient Rx  Name  Route  Sig  Dispense  Refill  . albuterol (PROVENTIL HFA;VENTOLIN HFA) 108 (90 BASE) MCG/ACT inhaler   Inhalation   Inhale 1-2 puffs into the lungs every 6 (six) hours as needed for wheezing.   1 Inhaler   0   . Chlorpheniramine Maleate ([facility] ALLERGY PO)   Oral   Take 1 tablet by mouth daily.          Marland Kitchen glimepiride (AMARYL) 4 MG tablet   Oral   Take 4 mg by mouth daily before breakfast.           . insulin glargine (LANTUS) 100 UNIT/ML injection   Subcutaneous   Inject 15 Units into the skin every morning. And 10 units at bedtime         . loperamide (IMODIUM) 2 MG capsule   Oral   Take 2 mg by mouth 4 (four) times daily as needed for diarrhea or loose stools.         . Multiple Vitamin (MULTIVITAMIN) tablet   Oral   Take 1 tablet by mouth daily.         . simvastatin (ZOCOR) 10 MG tablet   Oral   Take 10 mg by mouth at bedtime.           Marland Kitchen  Spacer/Aero-Holding Chambers (AEROCHAMBER PLUS FLO-VU LARGE) MISC   Other   1 each by Other route once.   1 each   0   . valsartan-hydrochlorothiazide (DIOVAN-HCT) 160-12.5 MG per tablet   Oral   Take 1 tablet by mouth daily.           Marland Kitchen EXPIRED: albuterol (PROVENTIL HFA;VENTOLIN HFA) 108 (90 BASE) MCG/ACT inhaler   Inhalation   Inhale 2 puffs into the lungs every 4 (four) hours as needed for wheezing.   1 Inhaler   0     BP 119/77  Pulse 108  Temp(Src) 98.4 F (36.9 C) (Oral)  Resp 18  SpO2 100%  Physical Exam  Nursing note and vitals reviewed. Constitutional: She is oriented to person, place, and time. She appears well-developed and well-nourished.  HENT:  Head: Normocephalic and atraumatic.  Eyes: Conjunctivae and EOM are normal. Pupils are equal, round, and reactive to light.  Neck: Normal range of motion. Neck supple.  Cardiovascular: Normal rate and regular rhythm.  Exam reveals no gallop and no friction rub.   No murmur heard. Pulmonary/Chest: Effort normal and breath sounds  normal. No respiratory distress. She has no wheezes. She has no rales. She exhibits no tenderness.  Abdominal: Soft. Bowel sounds are normal. She exhibits no distension and no mass. There is no tenderness. There is no rebound and no guarding.  No right lower cord tenderness, no McBurney point tenderness, no Murphy's sign, no peritoneal signs, no left lower quadrant tenderness  Musculoskeletal: Normal range of motion. She exhibits no edema and no tenderness.  Neurological: She is alert and oriented to person, place, and time.  Skin: Skin is warm and dry.  Psychiatric: She has a normal mood and affect. Her behavior is normal. Judgment and thought content normal.    ED Course  Procedures (including critical care time)  Labs Reviewed  GLUCOSE, CAPILLARY - Abnormal; Notable for the following:    Glucose-Capillary 217 (*)    All other components within normal limits  CBC WITH DIFFERENTIAL  BASIC METABOLIC PANEL   No results found.   No diagnosis found.    MDM  46 rolled female with nausea, vomiting, and diarrhea. Suspect viral gastroenteritis, and will treat the patient with fluids, and Zofran. Will obtain basic labs. Patient's pain is currently tolerable.  Patient's labs point towards dehydration as expected, as the patient is not having any acute abdominal pain remarkable for surgical abdomen, I will rehydrate and discharge.  7:28 PM Ordering another liter of fluid.  8:36 PM Patient signed out to Marlon Pel, PA-C, who will continue care at this time.    Plan: Fluid challenge, discharge with antiemetic.        Roxy Horseman, PA-C 09/16/12 2037

## 2013-03-06 ENCOUNTER — Encounter (HOSPITAL_COMMUNITY): Payer: Self-pay | Admitting: Emergency Medicine

## 2013-03-06 ENCOUNTER — Emergency Department (HOSPITAL_COMMUNITY)
Admission: EM | Admit: 2013-03-06 | Discharge: 2013-03-06 | Disposition: A | Discharge status: Home / Self Care | Payer: Medicare HMO | Attending: Emergency Medicine | Admitting: Emergency Medicine

## 2013-03-06 DIAGNOSIS — Z87891 Personal history of nicotine dependence: Secondary | ICD-10-CM | POA: Insufficient documentation

## 2013-03-06 DIAGNOSIS — R300 Dysuria: Secondary | ICD-10-CM | POA: Insufficient documentation

## 2013-03-06 DIAGNOSIS — I10 Essential (primary) hypertension: Secondary | ICD-10-CM | POA: Insufficient documentation

## 2013-03-06 DIAGNOSIS — M533 Sacrococcygeal disorders, not elsewhere classified: Secondary | ICD-10-CM | POA: Insufficient documentation

## 2013-03-06 DIAGNOSIS — Z8669 Personal history of other diseases of the nervous system and sense organs: Secondary | ICD-10-CM | POA: Insufficient documentation

## 2013-03-06 DIAGNOSIS — Z794 Long term (current) use of insulin: Secondary | ICD-10-CM | POA: Insufficient documentation

## 2013-03-06 DIAGNOSIS — E119 Type 2 diabetes mellitus without complications: Secondary | ICD-10-CM | POA: Insufficient documentation

## 2013-03-06 DIAGNOSIS — E7800 Pure hypercholesterolemia, unspecified: Secondary | ICD-10-CM | POA: Insufficient documentation

## 2013-03-06 DIAGNOSIS — Z79899 Other long term (current) drug therapy: Secondary | ICD-10-CM | POA: Insufficient documentation

## 2013-03-06 DIAGNOSIS — Z862 Personal history of diseases of the blood and blood-forming organs and certain disorders involving the immune mechanism: Secondary | ICD-10-CM | POA: Insufficient documentation

## 2013-03-06 DIAGNOSIS — N3090 Cystitis, unspecified without hematuria: Secondary | ICD-10-CM | POA: Insufficient documentation

## 2013-03-06 LAB — URINALYSIS, ROUTINE W REFLEX MICROSCOPIC
Bilirubin Urine: NEGATIVE
Glucose, UA: >1000 mg/dL — AB
Hgb urine dipstick: NEGATIVE
Ketones, ur: NEGATIVE mg/dL
Nitrite: NEGATIVE
Specific Gravity, Urine: 1.023 (ref 1.005–1.030)
pH: 5 (ref 5.0–8.0)

## 2013-03-06 LAB — URINE MICROSCOPIC-ADD ON

## 2013-03-06 MED ORDER — HYDROCODONE-ACETAMINOPHEN 5-325 MG PO TABS: 1.0000 | ORAL_TABLET | Freq: Four times a day (QID) | ORAL | Status: DC | PRN

## 2013-03-06 MED ORDER — IBUPROFEN 600 MG PO TABS: 600.0000 mg | ORAL_TABLET | Freq: Four times a day (QID) | ORAL | Status: DC | PRN

## 2013-03-06 MED ORDER — CYCLOBENZAPRINE HCL 10 MG PO TABS: 10.0000 mg | ORAL_TABLET | Freq: Two times a day (BID) | ORAL | Status: DC | PRN

## 2013-03-06 NOTE — ED Provider Notes (Signed)
CSN: [id number]     Arrival date & time 03/06/13  1733 History    This chart was scribed for non-physician practitioner working with Lemont Fillers A Christyn Gutkowski PA-C by Ashley Jacobs, ED scribe. This patient was seen in room WTR9/WTR9 and the patient's care was started at 6:24 PM   None    Chief Complaint  Patient presents with  . Back Pain    The history is provided by medical records. No language interpreter was used.   HPI Comments: Kirsten Shaffer is a 47 y.o. female who presents to the Emergency Department complaining of constant, moderate, right back pain with onset of 2 week PTA while exercising and she reports that the symptoms have worsened over the past 2 days. Pt reports pain is exaserbated when walking, sitting in certain chairs, movement. She mentions that her urine has a foul smell.  Pt reports that she recently had a bladder infection. She also mentions that upon sitting up she experiences weakness. Pt denies anything that seems to relieve the symptoms. No numbness or weakness in extremities. No loss of bladder or bowel control. No fever. No abdominal pain. No n/v/d. Did not try any medications.    Past Medical History  Diagnosis Date  . Diabetes mellitus   . Legally blind   . Glaucoma   . Hypertension   . Hypercholesterolemia   . Sickle cell trait    Past Surgical History  Procedure Laterality Date  . Cholecystectomy    . Abdominal hysterectomy    . Cesarean section    . Cataract extraction, bilateral    . Stent eye    . Retinal detachment surgery      bilaterally   Family History  Problem Relation Age of Onset  . Diabetes Mother   . Heart failure Mother    History  Substance Use Topics  . Smoking status: Former Games developer  . Smokeless tobacco: Not on file  . Alcohol Use: Yes     Comment: ocassionally   OB History   Grav Para Term Preterm Abortions TAB SAB Ect Mult Living                 Review of Systems  Genitourinary:       Foul smelling urine Pain  when retaining urine  Musculoskeletal: Positive for back pain.  Neurological: Negative for weakness and numbness.  All other systems reviewed and are negative.    Allergies  Review of patient's allergies indicates no known allergies.  Home Medications   Current Outpatient Rx  Name  Route  Sig  Dispense  Refill  . EXPIRED: albuterol (PROVENTIL HFA;VENTOLIN HFA) 108 (90 BASE) MCG/ACT inhaler   Inhalation   Inhale 2 puffs into the lungs every 4 (four) hours as needed for wheezing.   1 Inhaler   0   . albuterol (PROVENTIL HFA;VENTOLIN HFA) 108 (90 BASE) MCG/ACT inhaler   Inhalation   Inhale 1-2 puffs into the lungs every 6 (six) hours as needed for wheezing.   1 Inhaler   0   . Chlorpheniramine Maleate ([facility] ALLERGY PO)   Oral   Take 1 tablet by mouth daily.          Marland Kitchen dicyclomine (BENTYL) 20 MG tablet   Oral   Take 1 tablet (20 mg total) by mouth 2 (two) times daily.   20 tablet   0   . glimepiride (AMARYL) 4 MG tablet   Oral   Take 4 mg by mouth daily before breakfast.           .  insulin glargine (LANTUS) 100 UNIT/ML injection   Subcutaneous   Inject 15 Units into the skin every morning. And 10 units at bedtime         . loperamide (IMODIUM) 2 MG capsule   Oral   Take 2 mg by mouth 4 (four) times daily as needed for diarrhea or loose stools.         . metoCLOPramide (REGLAN) 10 MG tablet   Oral   Take 1 tablet (10 mg total) by mouth every 6 (six) hours.   14 tablet   0   . Multiple Vitamin (MULTIVITAMIN) tablet   Oral   Take 1 tablet by mouth daily.         . simvastatin (ZOCOR) 10 MG tablet   Oral   Take 10 mg by mouth at bedtime.           Marland Kitchen Spacer/Aero-Holding Chambers (AEROCHAMBER PLUS FLO-VU LARGE) MISC   Other   1 each by Other route once.   1 each   0   . valsartan-hydrochlorothiazide (DIOVAN-HCT) 160-12.5 MG per tablet   Oral   Take 1 tablet by mouth daily.            BP 146/93  Pulse 94  Temp(Src) 98.5 F (36.9 C) (Oral)   Resp 21  SpO2 100% Physical Exam  Nursing note and vitals reviewed. Constitutional: She is oriented to person, place, and time. She appears well-developed and well-nourished. No distress.  Awake, alert, nontoxic appearance.  HENT:  Head: Normocephalic and atraumatic.  Eyes: Conjunctivae and EOM are normal. Right eye exhibits no discharge. Left eye exhibits no discharge.  Neck: Normal range of motion. Neck supple.  Cardiovascular: Normal rate and regular rhythm.   Pulmonary/Chest: Effort normal. She exhibits no tenderness.  Abdominal: Soft. There is no tenderness. There is no rebound.  Musculoskeletal: She exhibits tenderness (R SI Joint).  Point Tenderness of right SI joint No midline tenderness  R para vertebral muscle tenderness Pain with right straight leg raise   Neurological: She is alert and oriented to person, place, and time. No cranial nerve deficit. Coordination normal.  5/5 and equal lower extremity strength. 2+ and equal patellar reflexes bilaterally. Pt able to dorsiflex bilateral toes and feet with good strength against resistance. Equal sensation bilaterally over thighs and lower legs.   Skin: Skin is warm and dry. No rash noted. She is not diaphoretic. No erythema.  Psychiatric: She has a normal mood and affect. Her behavior is normal. Thought content normal.    ED Course  DIAGNOSTIC STUDIES: Oxygen Saturation is 100% on room air, normal by my interpretation.    COORDINATION OF CARE: 6:28 PM Discussed course of care with pt. Pt understands and agrees.    Procedures (including critical care time)  Labs Reviewed  URINALYSIS, ROUTINE W REFLEX MICROSCOPIC - Abnormal; Notable for the following:    Glucose, UA >1000 (*)    All other components within normal limits  URINE MICROSCOPIC-ADD ON - Abnormal; Notable for the following:    Squamous Epithelial / LPF FEW (*)    Bacteria, UA FEW (*)    All other components within normal limits   No results found. 1.  Sacroiliac pain     MDM  Pt with lower back pain onset a week ago after exercising. States now painful with movement. Also foul odor to urine. Exam consistent with righ SI joint strain. No neuro deficits. No red flags to suggest cauda equina. Pt is ambulatory. UA unremarkable  other than glucose and few bacteria, few squamous epithelial cells. Will send culture. Pt is diabetic. States sugars have been under control. Will start on ibuprofen for pain. norco for severe pian. Flexeril for spasms. Follow up with pcp.   Filed Vitals:   03/06/13 1748  BP: 146/93  Pulse: 94  Temp: 98.5 F (36.9 C)  TempSrc: Oral  Resp: 21  SpO2: 100%     I personally performed the services described in this documentation, which was scribed in my presence. The recorded information has been reviewed and is accurate.    Lottie Mussel, PA-C 03/06/13 1951

## 2013-03-06 NOTE — ED Notes (Signed)
Per pt, was exercising 1 week ago, now having back discomfort-states urine has a foul odor

## 2013-03-07 NOTE — ED Provider Notes (Signed)
Medical screening examination/treatment/procedure(s) were performed by non-physician practitioner and as supervising physician I was immediately available for consultation/collaboration.   Shanna Cisco, MD 03/07/13 0040

## 2013-11-09 ENCOUNTER — Emergency Department (HOSPITAL_COMMUNITY): Payer: Medicare HMO

## 2013-11-09 ENCOUNTER — Emergency Department (HOSPITAL_COMMUNITY)
Admission: EM | Admit: 2013-11-09 | Discharge: 2013-11-10 | Disposition: A | Discharge status: Home / Self Care | Payer: Medicare HMO | Attending: Emergency Medicine | Admitting: Emergency Medicine

## 2013-11-09 ENCOUNTER — Encounter (HOSPITAL_COMMUNITY): Payer: Self-pay | Admitting: Emergency Medicine

## 2013-11-09 DIAGNOSIS — Z90710 Acquired absence of both cervix and uterus: Secondary | ICD-10-CM | POA: Insufficient documentation

## 2013-11-09 DIAGNOSIS — R1909 Other intra-abdominal and pelvic swelling, mass and lump: Secondary | ICD-10-CM | POA: Insufficient documentation

## 2013-11-09 DIAGNOSIS — Z794 Long term (current) use of insulin: Secondary | ICD-10-CM | POA: Insufficient documentation

## 2013-11-09 DIAGNOSIS — R0602 Shortness of breath: Secondary | ICD-10-CM | POA: Insufficient documentation

## 2013-11-09 DIAGNOSIS — I10 Essential (primary) hypertension: Secondary | ICD-10-CM | POA: Insufficient documentation

## 2013-11-09 DIAGNOSIS — H548 Legal blindness, as defined in USA: Secondary | ICD-10-CM | POA: Insufficient documentation

## 2013-11-09 DIAGNOSIS — Z79899 Other long term (current) drug therapy: Secondary | ICD-10-CM | POA: Insufficient documentation

## 2013-11-09 DIAGNOSIS — Z8669 Personal history of other diseases of the nervous system and sense organs: Secondary | ICD-10-CM | POA: Insufficient documentation

## 2013-11-09 DIAGNOSIS — Z862 Personal history of diseases of the blood and blood-forming organs and certain disorders involving the immune mechanism: Secondary | ICD-10-CM | POA: Insufficient documentation

## 2013-11-09 DIAGNOSIS — R319 Hematuria, unspecified: Secondary | ICD-10-CM | POA: Insufficient documentation

## 2013-11-09 DIAGNOSIS — M549 Dorsalgia, unspecified: Secondary | ICD-10-CM | POA: Insufficient documentation

## 2013-11-09 DIAGNOSIS — R1900 Intra-abdominal and pelvic swelling, mass and lump, unspecified site: Secondary | ICD-10-CM

## 2013-11-09 DIAGNOSIS — E119 Type 2 diabetes mellitus without complications: Secondary | ICD-10-CM | POA: Insufficient documentation

## 2013-11-09 DIAGNOSIS — Z87891 Personal history of nicotine dependence: Secondary | ICD-10-CM | POA: Insufficient documentation

## 2013-11-09 DIAGNOSIS — N12 Tubulo-interstitial nephritis, not specified as acute or chronic: Secondary | ICD-10-CM

## 2013-11-09 DIAGNOSIS — M7989 Other specified soft tissue disorders: Secondary | ICD-10-CM | POA: Insufficient documentation

## 2013-11-09 DIAGNOSIS — Z9089 Acquired absence of other organs: Secondary | ICD-10-CM | POA: Insufficient documentation

## 2013-11-09 DIAGNOSIS — R079 Chest pain, unspecified: Secondary | ICD-10-CM | POA: Insufficient documentation

## 2013-11-09 DIAGNOSIS — R110 Nausea: Secondary | ICD-10-CM | POA: Insufficient documentation

## 2013-11-09 DIAGNOSIS — E7800 Pure hypercholesterolemia, unspecified: Secondary | ICD-10-CM | POA: Insufficient documentation

## 2013-11-09 DIAGNOSIS — Z792 Long term (current) use of antibiotics: Secondary | ICD-10-CM | POA: Insufficient documentation

## 2013-11-09 DIAGNOSIS — R109 Unspecified abdominal pain: Secondary | ICD-10-CM

## 2013-11-09 LAB — URINALYSIS, ROUTINE W REFLEX MICROSCOPIC
Bilirubin Urine: NEGATIVE
GLUCOSE, UA: NEGATIVE mg/dL
HGB URINE DIPSTICK: NEGATIVE
KETONES UR: NEGATIVE mg/dL
Nitrite: POSITIVE — AB
PROTEIN: NEGATIVE mg/dL
Specific Gravity, Urine: 1.012 (ref 1.005–1.030)
Urobilinogen, UA: 0.2 mg/dL (ref 0.0–1.0)
pH: 5 (ref 5.0–8.0)

## 2013-11-09 LAB — CBC WITH DIFFERENTIAL/PLATELET
Basophils Absolute: 0 10*3/uL (ref 0.0–0.1)
Basophils Relative: 0 % (ref 0–1)
EOS ABS: 0.2 10*3/uL (ref 0.0–0.7)
Eosinophils Relative: 2 % (ref 0–5)
HCT: 32.9 % — ABNORMAL LOW (ref 36.0–46.0)
HEMOGLOBIN: 10.6 g/dL — AB (ref 12.0–15.0)
LYMPHS ABS: 3.8 10*3/uL (ref 0.7–4.0)
LYMPHS PCT: 38 % (ref 12–46)
MCH: 25.7 pg — AB (ref 26.0–34.0)
MCHC: 32.2 g/dL (ref 30.0–36.0)
MCV: 79.9 fL (ref 78.0–100.0)
MONOS PCT: 6 % (ref 3–12)
Monocytes Absolute: 0.6 10*3/uL (ref 0.1–1.0)
NEUTROS PCT: 53 % (ref 43–77)
Neutro Abs: 5.3 10*3/uL (ref 1.7–7.7)
Platelets: 252 10*3/uL (ref 150–400)
RBC: 4.12 MIL/uL (ref 3.87–5.11)
RDW: 14.7 % (ref 11.5–15.5)
WBC: 10 10*3/uL (ref 4.0–10.5)

## 2013-11-09 LAB — COMPREHENSIVE METABOLIC PANEL
ALK PHOS: 111 U/L (ref 39–117)
ALT: 31 U/L (ref 0–35)
AST: 23 U/L (ref 0–37)
Albumin: 3.9 g/dL (ref 3.5–5.2)
BILIRUBIN TOTAL: 0.4 mg/dL (ref 0.3–1.2)
BUN: 31 mg/dL — AB (ref 6–23)
CHLORIDE: 100 meq/L (ref 96–112)
CO2: 19 meq/L (ref 19–32)
Calcium: 10 mg/dL (ref 8.4–10.5)
Creatinine, Ser: 1.41 mg/dL — ABNORMAL HIGH (ref 0.50–1.10)
GFR, EST AFRICAN AMERICAN: 50 mL/min — AB (ref >90)
GFR, EST NON AFRICAN AMERICAN: 43 mL/min — AB (ref >90)
GLUCOSE: 228 mg/dL — AB (ref 70–99)
POTASSIUM: 5.5 meq/L — AB (ref 3.7–5.3)
Sodium: 135 mEq/L — ABNORMAL LOW (ref 137–147)
Total Protein: 8.2 g/dL (ref 6.0–8.3)

## 2013-11-09 LAB — TROPONIN I: Troponin I: <0.3 ng/mL (ref <0.30)

## 2013-11-09 LAB — CBG MONITORING, ED: Glucose-Capillary: 218 mg/dL — ABNORMAL HIGH (ref 70–99)

## 2013-11-09 LAB — PRO B NATRIURETIC PEPTIDE: PRO B NATRI PEPTIDE: 59.1 pg/mL (ref 0–125)

## 2013-11-09 LAB — URINE MICROSCOPIC-ADD ON

## 2013-11-09 LAB — LIPASE, BLOOD: Lipase: 57 U/L (ref 11–59)

## 2013-11-09 MED ORDER — DEXTROSE 5 % IV SOLN
1.0000 g | Freq: Once | INTRAVENOUS | Status: AC
  Administered 2013-11-10: 1 g via INTRAVENOUS
  Filled 2013-11-09: qty 10

## 2013-11-09 NOTE — ED Provider Notes (Signed)
CSN: [id number]     Arrival date & time 11/09/13  1939 History   First MD Initiated Contact with Patient 11/09/13 2103     Chief Complaint  Patient presents with  . Flank Pain  . Back Pain     (Consider location/radiation/quality/duration/timing/severity/associated sxs/prior Treatment) The history is provided by the patient. No language interpreter was used.  Kirsten Shaffer is a 48 y/o F with PMhx of DM, glaucoma, HTN, HCL, sickle cell trait, abdominal hysterectomy, cholecystectomy in 2009 presenting to the ED with right upper quadrant abdominal pain that started last night described as a dull aching sensation with radiation to her right flank and right breast described as a sharp pain. Patient reported that the pain worsens with deep inhalation. Patient reported that she has been having chest pain described as if there is an "elephant" sitting on her chest. Reported that the chest pain is intermittent. Reported that when she lays down she has difficulty breathing, reported that she has been having to sit upright. Patient reported that she has been having the feeling as if she is swelling up, reported that she feels swollen. Mother reported that she has noticed swelling to bilateral legs recently. Reported that she took 15 mg of lasix this morning. Denied fever, chills, vomiting, diarrhea, melena, hematochezia, difficulty breathing, weakness, syncope.  PCP Dr. Jeanie Cooks  Past Medical History  Diagnosis Date  . Diabetes mellitus   . Legally blind   . Glaucoma   . Hypertension   . Hypercholesterolemia   . Sickle cell trait    Past Surgical History  Procedure Laterality Date  . Cholecystectomy    . Abdominal hysterectomy    . Cesarean section    . Cataract extraction, bilateral    . Stent eye    . Retinal detachment surgery      bilaterally   Family History  Problem Relation Age of Onset  . Diabetes Mother   . Heart failure Mother    History  Substance Use Topics  . Smoking  status: Former Research scientist (life sciences)  . Smokeless tobacco: Not on file  . Alcohol Use: Yes     Comment: ocassionally   OB History   Grav Para Term Preterm Abortions TAB SAB Ect Mult Living                 Review of Systems  Constitutional: Negative for fever and chills.  Respiratory: Positive for shortness of breath. Negative for cough.   Cardiovascular: Positive for chest pain.  Gastrointestinal: Positive for nausea and abdominal pain. Negative for vomiting, diarrhea, constipation, blood in stool and anal bleeding.  Genitourinary: Positive for hematuria. Negative for decreased urine volume.  Musculoskeletal: Negative for back pain.  Neurological: Negative for dizziness and weakness.  All other systems reviewed and are negative.     Allergies  Metformin and related  Home Medications   Current Outpatient Rx  Name  Route  Sig  Dispense  Refill  . glimepiride (AMARYL) 4 MG tablet   Oral   Take 4 mg by mouth daily before breakfast.           . insulin glargine (LANTUS) 100 UNIT/ML injection   Subcutaneous   Inject 15-25 Units into the skin every morning. And 15 in am and 25 at bedtime         . insulin NPH Human (HUMULIN N,NOVOLIN N) 100 UNIT/ML injection   Subcutaneous   Inject 2-10 Units into the skin 2 (two) times daily before a meal. 351-400  10 units      151-200    2 units         . simvastatin (ZOCOR) 10 MG tablet   Oral   Take 10 mg by mouth at bedtime.           . valsartan-hydrochlorothiazide (DIOVAN-HCT) 160-12.5 MG per tablet   Oral   Take 1 tablet by mouth daily.           Marland Kitchen EXPIRED: albuterol (PROVENTIL HFA;VENTOLIN HFA) 108 (90 BASE) MCG/ACT inhaler   Inhalation   Inhale 2 puffs into the lungs every 4 (four) hours as needed for wheezing.   1 Inhaler   0   . cephALEXin (KEFLEX) 500 MG capsule   Oral   Take 1 capsule (500 mg total) by mouth 4 (four) times daily.   40 capsule   0    BP 143/59  Pulse 90  Temp(Src) 97.9 F (36.6 C) (Oral)  Resp 20   Wt 253 lb 6.4 oz (114.941 kg)  SpO2 100% Physical Exam  Nursing note and vitals reviewed. Constitutional: She is oriented to person, place, and time. She appears well-developed and well-nourished. No distress.  HENT:  Head: Normocephalic and atraumatic.  Mouth/Throat: Oropharynx is clear and moist. No oropharyngeal exudate.  Eyes: Conjunctivae and EOM are normal. Pupils are equal, round, and reactive to light. Right eye exhibits no discharge. Left eye exhibits no discharge.  Patient legally blind  Neck: Normal range of motion. Neck supple. No tracheal deviation present.  Cardiovascular: Normal rate, regular rhythm and normal heart sounds.  Exam reveals no friction rub.   No murmur heard. Pulses:      Radial pulses are 2+ on the right side, and 2+ on the left side.       Dorsalis pedis pulses are 2+ on the right side, and 2+ on the left side.  Swelling localized to the lower extremities bilaterally without pitting edema - mainly to the ankles bilaterally, very minimal   Pulmonary/Chest: Effort normal. No respiratory distress. She has no wheezes. She has no rales.  Minimally decreased breath sounds to upper and lower lobes bilaterally  Patient is able to speak in full sentences without difficulty  Negative use of accessory muscles Negative stridor  Abdominal: Soft. Bowel sounds are normal. There is tenderness. There is guarding.  Obese BS normoactive in all 4 quadrants Discomfort upon palpation to the right upper quadrant, positive murphy's sign  Epigastric pain   Musculoskeletal: Normal range of motion.  Full ROM to upper and lower extremities without difficulty noted, negative ataxia noted.  Lymphadenopathy:    She has no cervical adenopathy.  Neurological: She is alert and oriented to person, place, and time. No cranial nerve deficit. She exhibits normal muscle tone. Coordination normal.  Skin: Skin is warm and dry. No rash noted. She is not diaphoretic. No erythema.  Psychiatric:  She has a normal mood and affect. Her behavior is normal. Thought content normal.    ED Course  Procedures (including critical care time)  Results for orders placed during the hospital encounter of 11/09/13  URINALYSIS, ROUTINE W REFLEX MICROSCOPIC      Result Value Ref Range   Color, Urine YELLOW  YELLOW   APPearance CLOUDY (*) CLEAR   Specific Gravity, Urine 1.012  1.005 - 1.030   pH 5.0  5.0 - 8.0   Glucose, UA NEGATIVE  NEGATIVE mg/dL   Hgb urine dipstick NEGATIVE  NEGATIVE   Bilirubin Urine NEGATIVE  NEGATIVE  Ketones, ur NEGATIVE  NEGATIVE mg/dL   Protein, ur NEGATIVE  NEGATIVE mg/dL   Urobilinogen, UA 0.2  0.0 - 1.0 mg/dL   Nitrite POSITIVE (*) NEGATIVE   Leukocytes, UA SMALL (*) NEGATIVE  URINE MICROSCOPIC-ADD ON      Result Value Ref Range   WBC, UA 7-10  <3 WBC/hpf   Bacteria, UA MANY (*) RARE  CBC WITH DIFFERENTIAL      Result Value Ref Range   WBC 10.0  4.0 - 10.5 K/uL   RBC 4.12  3.87 - 5.11 MIL/uL   Hemoglobin 10.6 (*) 12.0 - 15.0 g/dL   HCT 32.9 (*) 36.0 - 46.0 %   MCV 79.9  78.0 - 100.0 fL   MCH 25.7 (*) 26.0 - 34.0 pg   MCHC 32.2  30.0 - 36.0 g/dL   RDW 14.7  11.5 - 15.5 %   Platelets 252  150 - 400 K/uL   Neutrophils Relative % 53  43 - 77 %   Neutro Abs 5.3  1.7 - 7.7 K/uL   Lymphocytes Relative 38  12 - 46 %   Lymphs Abs 3.8  0.7 - 4.0 K/uL   Monocytes Relative 6  3 - 12 %   Monocytes Absolute 0.6  0.1 - 1.0 K/uL   Eosinophils Relative 2  0 - 5 %   Eosinophils Absolute 0.2  0.0 - 0.7 K/uL   Basophils Relative 0  0 - 1 %   Basophils Absolute 0.0  0.0 - 0.1 K/uL  COMPREHENSIVE METABOLIC PANEL      Result Value Ref Range   Sodium 135 (*) 137 - 147 mEq/L   Potassium 5.5 (*) 3.7 - 5.3 mEq/L   Chloride 100  96 - 112 mEq/L   CO2 19  19 - 32 mEq/L   Glucose, Bld 228 (*) 70 - 99 mg/dL   BUN 31 (*) 6 - 23 mg/dL   Creatinine, Ser 1.41 (*) 0.50 - 1.10 mg/dL   Calcium 10.0  8.4 - 10.5 mg/dL   Total Protein 8.2  6.0 - 8.3 g/dL   Albumin 3.9  3.5 - 5.2  g/dL   AST 23  0 - 37 U/L   ALT 31  0 - 35 U/L   Alkaline Phosphatase 111  39 - 117 U/L   Total Bilirubin 0.4  0.3 - 1.2 mg/dL   GFR calc non Af Amer 43 (*) >90 mL/min   GFR calc Af Amer 50 (*) >90 mL/min  PRO B NATRIURETIC PEPTIDE      Result Value Ref Range   Pro B Natriuretic peptide (BNP) 59.1  0 - 125 pg/mL  TROPONIN I      Result Value Ref Range   Troponin I <0.30  <0.30 ng/mL  LIPASE, BLOOD      Result Value Ref Range   Lipase 57  11 - 59 U/L  TROPONIN I      Result Value Ref Range   Troponin I <0.30  <0.30 ng/mL  CBG MONITORING, ED      Result Value Ref Range   Glucose-Capillary 218 (*) 70 - 99 mg/dL   Comment 1 Notify RN    I-STAT CHEM 8, ED      Result Value Ref Range   Sodium 137  137 - 147 mEq/L   Potassium 4.7  3.7 - 5.3 mEq/L   Chloride 105  96 - 112 mEq/L   BUN 29 (*) 6 - 23 mg/dL   Creatinine, Ser 1.50 (*)  0.50 - 1.10 mg/dL   Glucose, Bld 150 (*) 70 - 99 mg/dL   Calcium, Ion 1.19  1.12 - 1.23 mmol/L   TCO2 20  0 - 100 mmol/L   Hemoglobin 10.9 (*) 12.0 - 15.0 g/dL   HCT 32.0 (*) 36.0 - 46.0 %    Labs Review Labs Reviewed  URINALYSIS, ROUTINE W REFLEX MICROSCOPIC - Abnormal; Notable for the following:    APPearance CLOUDY (*)    Nitrite POSITIVE (*)    Leukocytes, UA SMALL (*)    All other components within normal limits  URINE MICROSCOPIC-ADD ON - Abnormal; Notable for the following:    Bacteria, UA MANY (*)    All other components within normal limits  CBC WITH DIFFERENTIAL - Abnormal; Notable for the following:    Hemoglobin 10.6 (*)    HCT 32.9 (*)    MCH 25.7 (*)    All other components within normal limits  COMPREHENSIVE METABOLIC PANEL - Abnormal; Notable for the following:    Sodium 135 (*)    Potassium 5.5 (*)    Glucose, Bld 228 (*)    BUN 31 (*)    Creatinine, Ser 1.41 (*)    GFR calc non Af Amer 43 (*)    GFR calc Af Amer 50 (*)    All other components within normal limits  CBG MONITORING, ED - Abnormal; Notable for the following:     Glucose-Capillary 218 (*)    All other components within normal limits  I-STAT CHEM 8, ED - Abnormal; Notable for the following:    BUN 29 (*)    Creatinine, Ser 1.50 (*)    Glucose, Bld 150 (*)    Hemoglobin 10.9 (*)    HCT 32.0 (*)    All other components within normal limits  URINE CULTURE  PRO B NATRIURETIC PEPTIDE  TROPONIN I  LIPASE, BLOOD  TROPONIN I   Imaging Review Ct Abdomen Pelvis Wo Contrast  11/10/2013   CLINICAL DATA:  Right upper abdominal pain for 3 days.  EXAM: CT ABDOMEN AND PELVIS WITHOUT CONTRAST  TECHNIQUE: Multidetector CT imaging of the abdomen and pelvis was performed following the standard protocol without intravenous contrast.  COMPARISON:  CT ABD W/CM dated 10/09/2006; CT PELVIS W/CM dated 10/09/2006  FINDINGS: There is a trace right pleural effusion.  No renal, ureteral, or bladder calculi. No obstructive uropathy. No perinephric stranding is seen. The kidneys are symmetric in size without evidence for exophytic mass. The bladder is unremarkable.  The liver demonstrates no focal abnormality. The gallbladder is surgically absent. The spleen demonstrates no focal abnormality. The adrenal glands and pancreas are normal.  The stomach, duodenum, small intestine and large intestine are unremarkable. There is a normal caliber appendix in the right lower quadrant without periappendiceal inflammatory changes. There is no pneumoperitoneum, pneumatosis, or portal venous gas. There is no abdominal or pelvic free fluid. There is no lymphadenopathy. Prior hysterectomy. The ovaries are normal. There is a 2.4 cm soft tissue structure along the right lateral aspect of the dome of the bladder and adjacent to the medial aspect of the right ovary which may represent an exophytic portion of normal ovarian tissue, versus a soft tissue mass.  The abdominal aorta is normal in caliber.  There is a moderate broad-based disc bulge at L3-4.  IMPRESSION: 1. No acute abdominal or pelvic pathology.  2. Normal appendix. 3. There is a 2.4 cm soft tissue structure along the right lateral aspect of the dome of the  bladder and adjacent to the medial aspect of the right ovary which may represent an exophytic portion of normal ovarian tissue, versus a soft tissue mass. Recommend further evaluation with a pelvic MRI for better characterization.   Electronically Signed   By: Kathreen Devoid   On: 11/10/2013 02:08   Dg Chest 2 View  11/09/2013   CLINICAL DATA:  Back and flank pain.  EXAM: CHEST  2 VIEW  COMPARISON:  DG CHEST 2 VIEW dated 07/15/2012  FINDINGS: Cardiomediastinal silhouette is unremarkable. The lungs are clear without pleural effusions or focal consolidations. Trachea projects midline and there is no pneumothorax. Soft tissue planes and included osseous structures are non-suspicious. Mild degenerative change of the upper thoracic spine. Surgical clips in the abdomen likely reflect cholecystectomy. Multiple EKG lines overlie the patient and may obscure subtle underlying pathology.  IMPRESSION: No active cardiopulmonary disease.   Electronically Signed   By: Elon Alas   On: 11/09/2013 23:36     EKG Interpretation   Date/Time:  Sunday November 09 2013 22:27:28 EDT Ventricular Rate:  81 PR Interval:  126 QRS Duration: 90 QT Interval:  356 QTC Calculation: 413 R Axis:   65 Text Interpretation:  Sinus rhythm Low voltage, precordial leads No  significant change since last tracing Confirmed by YAO  MD, DAVID ([id number])  on 11/09/2013 10:33:39 PM      MDM   Final diagnoses:  Pyelonephritis  Pelvic mass  Abdominal pain   Medications  cefTRIAXone (ROCEPHIN) 1 g in dextrose 5 % 50 mL IVPB (0 g Intravenous Stopped 11/10/13 0042)  ondansetron (ZOFRAN) injection 4 mg (4 mg Intravenous Given 11/10/13 0009)  iohexol (OMNIPAQUE) 300 MG/ML solution 50 mL (50 mLs Oral Contrast Given 11/10/13 0030)   Filed Vitals:   11/09/13 2006 11/09/13 2344 11/09/13 2354  BP: 142/85  143/59  Pulse: 86  90   Temp: 98.2 F (36.8 C)  97.9 F (36.6 C)  TempSrc: Oral  Oral  Resp: 22  20  Weight:  253 lb 6.4 oz (114.941 kg)   SpO2: 98%  100%    Patient presenting to the ED with RUQ abdominal pain that started last night described as a dull aching sensation with intermittent sharp pain with radiation to the right breast and right flank. Patient reported that she has been feeling nauseous. Reported that she had a cholecystectomy performed in 2009. Patient reported that she has been having chest pain described as an "elephant" sitting on her chest that started last night intermittent with worsening of difficulty breathing and shortness of breath only when patient is laying down. Patient reported that she has been having sensation of feeling swollen, mother reported that patient has been having leg swelling recently - mother just came into town tonight.  Alert and oriented. GCS 15. Heart rate and rhythm normal. Radial and DP pulses 2+ bilaterally. Lungs noted to have minimal diminished breath sounds bilaterally to upper and lower lobes. Good lung expansion. Patient is able to speak in full sentences without difficulty. Negative stridor. Negative pain upon palpation to the chest wall. Obese. Bowel sounds normoactive in all 4 quadrants with discomfort upon palpation to epigastric region right upper quadrant. Minimal swelling localized to lower extremities bilaterally, negative pitting edema identified. Negative signs respiratory distress. EKG noted normal sinus rhythm with a heart rate of 81 beats per minute, low voltage-negative significant changes since previous tracing. Troponin negative elevation. Second troponin negative elevation. CBC negative elevated white blood cell count-negative left shift or leukocytosis  noted. CMP noted elevated BUN and creatinine, BUN of 31 and creatinine of 1.41. Anion gap of 16.0 mEq per liter. Hyperkalemia of 5.5. Lipase negative elevation. BNP negative elevation. Urinalysis noted  positive nitrites and small leukocytes with many bacteria, pyuria of with a white blood cell count of 7-10. Chest x-ray negative for acute cardiac pulmonary disease. Repeat check of electrolytes after fluid identified closing an anion gap from 16.0 mEq per liter to 12.0 mEq per liter. Glucose 150. Potassium has improved to 4.7. CT abdomen and pelvis without contrast identified a 2.4 cm soft tissue structure along the right lateral aspect of the bladder and adjacent to the right aspect of the right ovary-cancer cannot be ruled out. Discussed case, labs imaging in great detail with Dr. Marylene Buerger - patient is not septic and patient can be discharged home. Recommended patient to be followed-up with OBGYN.  HEART score 3. Patient presenting with pyelonephritis - started on IV fluids and rocephin. Anion gap of 12.0 mEq/L with glucose level of 150 with negative ketones in the urine - doubt DKA. Negative acute abnormalities on the CT abdomen pelvis-doubt appendicitis, doubt pancreatitis-gallbladder surgically absent. Negative findings of colitis. Negative pleural effusion, pneumothorax or cardiomegaly identified on the chest x-ray-doubt CHF. Patient stable, afebrile-not tachycardic, nontachypneic, pulse ox 100% on room air. Patient not septic appearing. Discharged patient. Discharge patient with antibiotics. Referred patient to primary care provider and OB/GYN. Discussed CT abdomen and pelvis without contrast findings with patient in great detail, voiced concern of possible beginnings of cancer and highly recommended patient to followup with OB/GYN - patient understood and agreed to plan. Discussed with patient to closely monitor symptoms and if symptoms are to worsen or change to report back to the ED - strict return instructions given.  Patient agreed to plan of care, understood, all questions answered.   Jamse Mead, PA-C 11/10/13 [phone]

## 2013-11-09 NOTE — ED Notes (Signed)
Pt complains of right upper abd pain for three days that hurts when she breathes and lays down

## 2013-11-10 ENCOUNTER — Emergency Department (HOSPITAL_COMMUNITY): Payer: Medicare HMO

## 2013-11-10 LAB — I-STAT CHEM 8, ED
BUN: 29 mg/dL — ABNORMAL HIGH (ref 6–23)
CREATININE: 1.5 mg/dL — AB (ref 0.50–1.10)
Calcium, Ion: 1.19 mmol/L (ref 1.12–1.23)
Chloride: 105 mEq/L (ref 96–112)
Glucose, Bld: 150 mg/dL — ABNORMAL HIGH (ref 70–99)
HCT: 32 % — ABNORMAL LOW (ref 36.0–46.0)
Hemoglobin: 10.9 g/dL — ABNORMAL LOW (ref 12.0–15.0)
Potassium: 4.7 mEq/L (ref 3.7–5.3)
SODIUM: 137 meq/L (ref 137–147)
TCO2: 20 mmol/L (ref 0–100)

## 2013-11-10 LAB — TROPONIN I: Troponin I: <0.3 ng/mL (ref <0.30)

## 2013-11-10 MED ORDER — CEPHALEXIN 500 MG PO CAPS: 500.0000 mg | ORAL_CAPSULE | Freq: Four times a day (QID) | ORAL | Status: DC

## 2013-11-10 MED ORDER — IOHEXOL 300 MG/ML  SOLN
50.0000 mL | Freq: Once | INTRAMUSCULAR | Status: AC | PRN
  Administered 2013-11-10: 50 mL via ORAL

## 2013-11-10 MED ORDER — ONDANSETRON HCL 4 MG/2ML IJ SOLN
4.0000 mg | Freq: Once | INTRAMUSCULAR | Status: AC
  Administered 2013-11-10: 4 mg via INTRAVENOUS
  Filled 2013-11-10: qty 2

## 2013-11-10 NOTE — Discharge Instructions (Signed)
Please call your doctor for a followup appointment within 24-48 hours. When you talk to your doctor please let them know that you were seen in the emergency department and have them acquire all of your records so that they can discuss the findings with you and formulate a treatment plan to fully care for your new and ongoing problems. Please call and set-up an appointment with your PCP and OBGYN  It is important to followed-up with your OBGYN regarding mass findings on CT scan of the abdomen and pelvis that was performed today Please rest and stay hydrated Please continue to monitor symptoms and if symptoms are to worsen or change (fever greater than 101, chills, chest pain, shortness of breath, difficulty breathing, nausea, vomiting, diarrhea, worsening changes to abdominal pain, abdominal distention, bloating sensation, inability to pass gas, decreased bowel movements, pain with urination, blood in the stools, black tarry stools, blood in the urine) please report back to the ED immediately  Pyelonephritis, Adult Pyelonephritis is a kidney infection. In general, there are 2 main types of pyelonephritis:  Infections that come on quickly without any warning (acute pyelonephritis).  Infections that persist for a long period of time (chronic pyelonephritis). CAUSES  Two main causes of pyelonephritis are:  Bacteria traveling from the bladder to the kidney. This is a problem especially in pregnant women. The urine in the bladder can become filled with bacteria from multiple causes, including:  Inflammation of the prostate gland (prostatitis).  Sexual intercourse in females.  Bladder infection (cystitis).  Bacteria traveling from the bloodstream to the tissue part of the kidney. Problems that may increase your risk of getting a kidney infection include:  Diabetes.  Kidney stones or bladder stones.  Cancer.  Catheters placed in the bladder.  Other abnormalities of the kidney or  ureter. SYMPTOMS   Abdominal pain.  Pain in the side or flank area.  Fever.  Chills.  Upset stomach.  Blood in the urine (dark urine).  Frequent urination.  Strong or persistent urge to urinate.  Burning or stinging when urinating. DIAGNOSIS  Your caregiver may diagnose your kidney infection based on your symptoms. A urine sample may also be taken. TREATMENT  In general, treatment depends on how severe the infection is.   If the infection is mild and caught early, your caregiver may treat you with oral antibiotics and send you home.  If the infection is more severe, the bacteria may have gotten into the bloodstream. This will require intravenous (IV) antibiotics and a hospital stay. Symptoms may include:  High fever.  Severe flank pain.  Shaking chills.  Even after a hospital stay, your caregiver may require you to be on oral antibiotics for a period of time.  Other treatments may be required depending upon the cause of the infection. HOME CARE INSTRUCTIONS   Take your antibiotics as directed. Finish them even if you start to feel better.  Make an appointment to have your urine checked to make sure the infection is gone.  Drink enough fluids to keep your urine clear or pale yellow.  Take medicines for the bladder if you have urgency and frequency of urination as directed by your caregiver. SEEK IMMEDIATE MEDICAL CARE IF:   You have a fever or persistent symptoms for more than 2-3 days.  You have a fever and your symptoms suddenly get worse.  You are unable to take your antibiotics or fluids.  You develop shaking chills.  You experience extreme weakness or fainting.  There is  no improvement after 2 days of treatment. MAKE SURE YOU:  Understand these instructions.  Will watch your condition.  Will get help right away if you are not doing well or get worse. Document Released: 07/17/2005 Document Revised: 01/16/2012 Document Reviewed:  12/21/2010 [facility] Patient Information 2014 [place], [place].

## 2013-11-11 ENCOUNTER — Other Ambulatory Visit: Payer: Self-pay | Admitting: Obstetrics and Gynecology

## 2013-11-11 DIAGNOSIS — IMO0002 Reserved for concepts with insufficient information to code with codable children: Secondary | ICD-10-CM

## 2013-11-11 DIAGNOSIS — R229 Localized swelling, mass and lump, unspecified: Principal | ICD-10-CM

## 2013-11-12 LAB — URINE CULTURE
Colony Count: >=100000
SPECIAL REQUESTS: NORMAL

## 2013-11-13 NOTE — ED Notes (Signed)
+   Urine Patient treated with Cephalexin -treated per protocol MD.

## 2013-11-16 NOTE — ED Provider Notes (Signed)
Medical screening examination/treatment/procedure(s) were conducted as a shared visit with non-physician practitioner(s) and myself.  I personally evaluated the patient during the encounter.   EKG Interpretation   Date/Time:  Sunday November 09 2013 22:27:28 EDT Ventricular Rate:  81 PR Interval:  126 QRS Duration: 90 QT Interval:  356 QTC Calculation: 413 R Axis:   65 Text Interpretation:  Sinus rhythm Low voltage, precordial leads No  significant change since last tracing Confirmed by YAO  MD, DAVID ([id number])  on 11/09/2013 10:33:39 PM      Overall well appearing. Multiple complaints. Doubt ACS. Doubt PE. Follow up with pcp. ecg and troponin without signficant abnormality. Nothing acute on andominal CT. Outpatient follow up regarding incidental findings. Ob gyn follow up for same. Pt understands importance of follow up and possible additional need for advance imaging   Hoy Morn, MD 11/16/13 1407

## 2013-11-18 ENCOUNTER — Ambulatory Visit
Admission: RE | Admit: 2013-11-18 | Discharge: 2013-11-18 | Disposition: A | Discharge status: Home / Self Care | Payer: Medicare HMO | Source: Ambulatory Visit | Attending: Obstetrics and Gynecology | Admitting: Obstetrics and Gynecology

## 2013-11-18 DIAGNOSIS — IMO0002 Reserved for concepts with insufficient information to code with codable children: Secondary | ICD-10-CM

## 2013-11-18 DIAGNOSIS — R229 Localized swelling, mass and lump, unspecified: Principal | ICD-10-CM

## 2013-11-28 ENCOUNTER — Ambulatory Visit: Admission: RE | Admit: 2013-11-28 | Payer: Medicare HMO | Source: Ambulatory Visit

## 2013-11-28 ENCOUNTER — Ambulatory Visit
Admission: RE | Admit: 2013-11-28 | Discharge: 2013-11-28 | Disposition: A | Discharge status: Home / Self Care | Payer: Medicare HMO | Source: Ambulatory Visit | Attending: Obstetrics and Gynecology | Admitting: Obstetrics and Gynecology

## 2014-02-14 ENCOUNTER — Encounter (HOSPITAL_COMMUNITY): Payer: Self-pay | Admitting: Emergency Medicine

## 2014-02-14 ENCOUNTER — Emergency Department (HOSPITAL_COMMUNITY)
Admission: EM | Admit: 2014-02-14 | Discharge: 2014-02-14 | Disposition: A | Discharge status: Home / Self Care | Payer: Medicare HMO | Attending: Emergency Medicine | Admitting: Emergency Medicine

## 2014-02-14 DIAGNOSIS — E119 Type 2 diabetes mellitus without complications: Secondary | ICD-10-CM | POA: Insufficient documentation

## 2014-02-14 DIAGNOSIS — B354 Tinea corporis: Secondary | ICD-10-CM | POA: Insufficient documentation

## 2014-02-14 DIAGNOSIS — E7800 Pure hypercholesterolemia, unspecified: Secondary | ICD-10-CM | POA: Insufficient documentation

## 2014-02-14 DIAGNOSIS — Z79899 Other long term (current) drug therapy: Secondary | ICD-10-CM | POA: Insufficient documentation

## 2014-02-14 DIAGNOSIS — Z862 Personal history of diseases of the blood and blood-forming organs and certain disorders involving the immune mechanism: Secondary | ICD-10-CM | POA: Insufficient documentation

## 2014-02-14 DIAGNOSIS — I10 Essential (primary) hypertension: Secondary | ICD-10-CM | POA: Insufficient documentation

## 2014-02-14 DIAGNOSIS — L237 Allergic contact dermatitis due to plants, except food: Secondary | ICD-10-CM

## 2014-02-14 DIAGNOSIS — L255 Unspecified contact dermatitis due to plants, except food: Secondary | ICD-10-CM | POA: Insufficient documentation

## 2014-02-14 DIAGNOSIS — H548 Legal blindness, as defined in USA: Secondary | ICD-10-CM | POA: Insufficient documentation

## 2014-02-14 DIAGNOSIS — Z87891 Personal history of nicotine dependence: Secondary | ICD-10-CM | POA: Insufficient documentation

## 2014-02-14 DIAGNOSIS — Z794 Long term (current) use of insulin: Secondary | ICD-10-CM | POA: Insufficient documentation

## 2014-02-14 MED ORDER — KETOCONAZOLE 200 MG PO TABS: 200.0000 mg | ORAL_TABLET | Freq: Every day | ORAL | Status: DC

## 2014-02-14 MED ORDER — PREDNISONE 20 MG PO TABS: 40.0000 mg | ORAL_TABLET | Freq: Every day | ORAL | Status: DC

## 2014-02-14 MED ORDER — KETOCONAZOLE 2 % EX CREA: 1.0000 "application " | TOPICAL_CREAM | Freq: Every day | CUTANEOUS | Status: DC

## 2014-02-14 NOTE — ED Notes (Signed)
Pt has rash on bilateral back.  Started on one side and then the other.  No pain, itching only.  Pt also has area to right inner elbow ? Ring worm.

## 2014-02-14 NOTE — Discharge Instructions (Signed)
Take Prednisone as directed until gone. Use Ketoconazole on your ring worm on your arm. Refer to attached documents for more information.

## 2014-02-14 NOTE — ED Provider Notes (Signed)
CSN: [id number]     Arrival date & time 02/14/14  1719 History  This chart was scribed for non-physician practitioner, Alvina Chou, PA-C working with Hoy Morn, MD by Frederich Balding, ED scribe. This patient was seen in room WTR1/WLPT1 and the patient's care was started at 6:33 PM.   Chief Complaint  Patient presents with  . Rash   The history is provided by the patient. No language interpreter was used.   HPI Comments: Kirsten Shaffer is a 48 y.o. female who presents to the Emergency Department complaining of gradual onset, worsening, burning rash that started about 5 days ago. States it started on the left side of her back then moved to the right side. She has taken benadryl and used calamine lotion with some relief. States she has also used hydrocortisone cream with no relief. Pt also has a spot on her right elbow that she thinks is ringworm.    Past Medical History  Diagnosis Date  . Diabetes mellitus   . Legally blind   . Glaucoma   . Hypertension   . Hypercholesterolemia   . Sickle cell trait    Past Surgical History  Procedure Laterality Date  . Cholecystectomy    . Abdominal hysterectomy    . Cesarean section    . Cataract extraction, bilateral    . Stent eye    . Retinal detachment surgery      bilaterally   Family History  Problem Relation Age of Onset  . Diabetes Mother   . Heart failure Mother    History  Substance Use Topics  . Smoking status: Former Research scientist (life sciences)  . Smokeless tobacco: Not on file  . Alcohol Use: Yes     Comment: ocassionally   OB History   Grav Para Term Preterm Abortions TAB SAB Ect Mult Living                 Review of Systems  Skin: Positive for rash.  All other systems reviewed and are negative.  Allergies  Metformin and related  Home Medications   Prior to Admission medications   Medication Sig Start Date End Date Taking? Authorizing Provider  glimepiride (AMARYL) 4 MG tablet Take 4 mg by mouth daily before breakfast.      Yes Historical Provider, MD  insulin glargine (LANTUS) 100 UNIT/ML injection Inject 15-25 Units into the skin every morning. And 15 in am and 25 at bedtime   Yes Historical Provider, MD  insulin NPH Human (HUMULIN N,NOVOLIN N) 100 UNIT/ML injection Inject 2-10 Units into the skin 2 (two) times daily before a meal. 351-400    10 units      151-200    2 units   Yes Historical Provider, MD  simvastatin (ZOCOR) 10 MG tablet Take 10 mg by mouth at bedtime.     Yes Historical Provider, MD  valsartan-hydrochlorothiazide (DIOVAN-HCT) 160-12.5 MG per tablet Take 1 tablet by mouth daily.     Yes Historical Provider, MD  albuterol (PROVENTIL HFA;VENTOLIN HFA) 108 (90 BASE) MCG/ACT inhaler Inhale 2 puffs into the lungs every 4 (four) hours as needed for wheezing. 07/07/11 07/06/12  Blair Heys, MD   BP 137/80  Pulse 100  Temp(Src) 98.8 F (37.1 C) (Oral)  Resp 20  SpO2 99%  Physical Exam  Nursing note and vitals reviewed. Constitutional: She is oriented to person, place, and time. She appears well-developed and well-nourished. No distress.  HENT:  Head: Normocephalic and atraumatic.  Eyes: Conjunctivae and EOM are  normal.  Neck: Neck supple. No tracheal deviation present.  Cardiovascular: Normal rate.   Pulmonary/Chest: Effort normal. No respiratory distress.  Musculoskeletal: Normal range of motion.  Neurological: She is alert and oriented to person, place, and time.  Skin: Skin is warm and dry.  Dry, scaling papules on bilateral back and right abdomen, also extending to the left flank. There is another area on her central chest. There is a dry, scaling patch on an erythematous base of the right proximal forearm.   Psychiatric: She has a normal mood and affect. Her behavior is normal.    ED Course  Procedures (including critical care time)  DIAGNOSTIC STUDIES: Oxygen Saturation is 99% on RA, normal by my interpretation.    COORDINATION OF CARE: 6:35 PM-Discussed treatment plan which  includes prednisone and antifungal cream with pt at bedside and pt agreed to plan. Advised pt to continue using calamine lotion.   Labs Review Labs Reviewed - No data to display  Imaging Review No results found.   EKG Interpretation None      MDM   Final diagnoses:  Poison ivy  Ringworm of body    Patient appears to have poison ivy on her back and abdomen. The areas are itchy. She denies pain. She also has an area on her right arm that appears to be ring worm. Patient will have prednisone and ketoconazole cream for symptoms. Vitals stable and patient afebrile.   I personally performed the services described in this documentation, which was scribed in my presence. The recorded information has been reviewed and is accurate.  Alvina Chou, PA-C 02/14/14 1927

## 2014-02-14 NOTE — ED Provider Notes (Signed)
Medical screening examination/treatment/procedure(s) were performed by non-physician practitioner and as supervising physician I was immediately available for consultation/collaboration.   EKG Interpretation None        Hoy Morn, MD 02/14/14 2035

## 2014-03-31 ENCOUNTER — Encounter (HOSPITAL_COMMUNITY): Payer: Self-pay | Admitting: Emergency Medicine

## 2014-03-31 ENCOUNTER — Emergency Department (HOSPITAL_COMMUNITY)
Admission: EM | Admit: 2014-03-31 | Discharge: 2014-03-31 | Disposition: A | Discharge status: Home / Self Care | Payer: Medicare HMO | Attending: Emergency Medicine | Admitting: Emergency Medicine

## 2014-03-31 DIAGNOSIS — Z794 Long term (current) use of insulin: Secondary | ICD-10-CM | POA: Insufficient documentation

## 2014-03-31 DIAGNOSIS — S8990XA Unspecified injury of unspecified lower leg, initial encounter: Secondary | ICD-10-CM | POA: Insufficient documentation

## 2014-03-31 DIAGNOSIS — Z87891 Personal history of nicotine dependence: Secondary | ICD-10-CM | POA: Insufficient documentation

## 2014-03-31 DIAGNOSIS — Y929 Unspecified place or not applicable: Secondary | ICD-10-CM | POA: Diagnosis not present

## 2014-03-31 DIAGNOSIS — Z8669 Personal history of other diseases of the nervous system and sense organs: Secondary | ICD-10-CM | POA: Insufficient documentation

## 2014-03-31 DIAGNOSIS — Y939 Activity, unspecified: Secondary | ICD-10-CM | POA: Insufficient documentation

## 2014-03-31 DIAGNOSIS — IMO0002 Reserved for concepts with insufficient information to code with codable children: Secondary | ICD-10-CM | POA: Diagnosis not present

## 2014-03-31 DIAGNOSIS — S99919A Unspecified injury of unspecified ankle, initial encounter: Secondary | ICD-10-CM | POA: Diagnosis present

## 2014-03-31 DIAGNOSIS — Z862 Personal history of diseases of the blood and blood-forming organs and certain disorders involving the immune mechanism: Secondary | ICD-10-CM | POA: Insufficient documentation

## 2014-03-31 DIAGNOSIS — E7800 Pure hypercholesterolemia, unspecified: Secondary | ICD-10-CM | POA: Diagnosis not present

## 2014-03-31 DIAGNOSIS — R296 Repeated falls: Secondary | ICD-10-CM | POA: Insufficient documentation

## 2014-03-31 DIAGNOSIS — Z79899 Other long term (current) drug therapy: Secondary | ICD-10-CM | POA: Diagnosis not present

## 2014-03-31 DIAGNOSIS — E119 Type 2 diabetes mellitus without complications: Secondary | ICD-10-CM | POA: Insufficient documentation

## 2014-03-31 DIAGNOSIS — I10 Essential (primary) hypertension: Secondary | ICD-10-CM | POA: Diagnosis not present

## 2014-03-31 DIAGNOSIS — M2392 Unspecified internal derangement of left knee: Secondary | ICD-10-CM

## 2014-03-31 DIAGNOSIS — S99929A Unspecified injury of unspecified foot, initial encounter: Secondary | ICD-10-CM

## 2014-03-31 MED ORDER — HYDROCODONE-ACETAMINOPHEN 5-325 MG PO TABS: 1.0000 | ORAL_TABLET | Freq: Four times a day (QID) | ORAL | Status: DC | PRN

## 2014-03-31 MED ORDER — HYDROCODONE-ACETAMINOPHEN 5-325 MG PO TABS
1.0000 | ORAL_TABLET | Freq: Once | ORAL | Status: AC
  Administered 2014-03-31: 1 via ORAL
  Filled 2014-03-31: qty 1

## 2014-03-31 NOTE — ED Notes (Signed)
Pt states she fell 3 weeks ago and now has pain in L knee. Alert and oriented.

## 2014-03-31 NOTE — Discharge Instructions (Signed)

## 2014-03-31 NOTE — ED Provider Notes (Signed)
CSN: [id number]     Arrival date & time 03/31/14  1938 History  This chart was scribed for Kirsten Creamer, NP working with Kirsten Mires, MD by Kirsten Shaffer, ED Scribe. This patient was seen in room WTR8/WTR8 and the patient's care was started at 9:11 PM.      Chief Complaint  Patient presents with  . Knee Pain   The history is provided by the patient. No language interpreter was used.   HPI Comments: Kirsten Shaffer is a 48 y.o. female who presents to the Emergency Department complaining of throbbing left knee pain onset 3 weeks prior. She states the knee has been gradually worsening. She has associated swelling. She states she was twisting and heard her knee pop. She states she has been wearing a knee brace with no relief. She states the pain radiates down to her toes. She states she has taken motrin with no relief. She states that walking makes her pain worse. She states she has an appointment with her PCP on September 29th.   Past Medical History  Diagnosis Date  . Diabetes mellitus   . Legally blind   . Glaucoma   . Hypertension   . Hypercholesterolemia   . Sickle cell trait    Past Surgical History  Procedure Laterality Date  . Cholecystectomy    . Abdominal hysterectomy    . Cesarean section    . Cataract extraction, bilateral    . Stent eye    . Retinal detachment surgery      bilaterally   Family History  Problem Relation Age of Onset  . Diabetes Mother   . Heart failure Mother    History  Substance Use Topics  . Smoking status: Former Research scientist (life sciences)  . Smokeless tobacco: Not on file  . Alcohol Use: Yes     Comment: ocassionally   OB History   Grav Para Term Preterm Abortions TAB SAB Ect Mult Living                 Review of Systems  Constitutional: Negative for fever.  Musculoskeletal: Positive for joint swelling.  Skin: Negative for wound.  Neurological: Negative for weakness and numbness.  All other systems reviewed and are negative.     Allergies   Metformin and related  Home Medications   Prior to Admission medications   Medication Sig Start Date End Date Taking? Authorizing Provider  albuterol (PROVENTIL HFA;VENTOLIN HFA) 108 (90 BASE) MCG/ACT inhaler Inhale 2 puffs into the lungs every 4 (four) hours as needed for wheezing. 07/07/11 07/06/12  Blair Heys, MD  glimepiride (AMARYL) 4 MG tablet Take 4 mg by mouth daily before breakfast.      Historical Provider, MD  insulin glargine (LANTUS) 100 UNIT/ML injection Inject 15-25 Units into the skin every morning. And 15 in am and 25 at bedtime    Historical Provider, MD  insulin NPH Human (HUMULIN N,NOVOLIN N) 100 UNIT/ML injection Inject 2-10 Units into the skin 2 (two) times daily before a meal. 351-400    10 units      151-200    2 units    Historical Provider, MD  ketoconazole (NIZORAL) 2 % cream Apply 1 application topically daily. 02/14/14   Kaitlyn Szekalski, PA-C  predniSONE (DELTASONE) 20 MG tablet Take 2 tablets (40 mg total) by mouth daily. 02/14/14   Kaitlyn Szekalski, PA-C  simvastatin (ZOCOR) 10 MG tablet Take 10 mg by mouth at bedtime.      Historical Provider, MD  valsartan-hydrochlorothiazide (DIOVAN-HCT) 160-12.5 MG per tablet Take 1 tablet by mouth daily.      Historical Provider, MD   Triage Vitals: BP 160/70  Pulse 104  Temp(Src) 98.9 F (37.2 C) (Oral)  Resp 22  Ht 5\' 7"  (1.702 m)  Wt 258 lb (117.028 kg)  BMI 40.40 kg/m2  SpO2 100%  Physical Exam  Nursing note and vitals reviewed. Constitutional: She is oriented to person, place, and time. She appears well-developed and well-nourished.  HENT:  Head: Normocephalic.  Eyes: Pupils are equal, round, and reactive to light.  Neck: Normal range of motion.  Cardiovascular: Normal rate.   Pulmonary/Chest: Effort normal.  Musculoskeletal: Normal range of motion. She exhibits tenderness. She exhibits no edema.       Left knee: She exhibits swelling and effusion. She exhibits normal range of motion, no ecchymosis, no  deformity, no laceration, no erythema, normal alignment, no LCL laxity, normal patellar mobility, no bony tenderness and no MCL laxity. Tenderness found. Medial joint line tenderness noted. No patellar tendon tenderness noted.       Legs: Neurological: She is alert and oriented to person, place, and time.  Skin: Skin is warm and dry.    ED Course  Procedures (including critical care time) DIAGNOSTIC STUDIES: Oxygen Saturation is 100% on RA, normal by my interpretation.    COORDINATION OF CARE: 9:29 PM-Discussed treatment plan which includes pain medication and knee immobilizer with pt at bedside and pt agreed to plan.     Labs Review Labs Reviewed - No data to display  Imaging Review No results found.   EKG Interpretation None      MDM   Final diagnoses:  Internal derangement of knee joint, left         I personally performed the services described in this documentation, which was scribed in my presence. The recorded information has been reviewed and is accurate.     Garald Balding, NP 03/31/14 2146

## 2014-04-07 NOTE — ED Provider Notes (Signed)
Medical screening examination/treatment/procedure(s) were performed by non-physician practitioner and as supervising physician I was immediately available for consultation/collaboration.     [name], MD 04/07/14 [phone]

## 2014-04-08 ENCOUNTER — Ambulatory Visit (HOSPITAL_COMMUNITY)
Admission: RE | Admit: 2014-04-08 | Discharge: 2014-04-08 | Disposition: A | Discharge status: Home / Self Care | Payer: Medicare HMO | Source: Ambulatory Visit | Attending: Emergency Medicine | Admitting: Emergency Medicine

## 2014-04-08 DIAGNOSIS — IMO0002 Reserved for concepts with insufficient information to code with codable children: Secondary | ICD-10-CM | POA: Insufficient documentation

## 2014-04-08 DIAGNOSIS — M25469 Effusion, unspecified knee: Secondary | ICD-10-CM | POA: Diagnosis not present

## 2014-04-08 DIAGNOSIS — X58XXXA Exposure to other specified factors, initial encounter: Secondary | ICD-10-CM | POA: Diagnosis not present

## 2014-04-08 DIAGNOSIS — M8430XA Stress fracture, unspecified site, initial encounter for fracture: Secondary | ICD-10-CM | POA: Diagnosis not present

## 2014-04-08 DIAGNOSIS — M25569 Pain in unspecified knee: Secondary | ICD-10-CM | POA: Diagnosis present

## 2014-09-04 ENCOUNTER — Emergency Department (HOSPITAL_COMMUNITY)
Admission: EM | Admit: 2014-09-04 | Discharge: 2014-09-04 | Disposition: A | Discharge status: Home / Self Care | Payer: Medicare HMO | Attending: Emergency Medicine | Admitting: Emergency Medicine

## 2014-09-04 ENCOUNTER — Emergency Department (HOSPITAL_COMMUNITY): Payer: Medicare HMO

## 2014-09-04 ENCOUNTER — Encounter (HOSPITAL_COMMUNITY): Payer: Self-pay | Admitting: Emergency Medicine

## 2014-09-04 DIAGNOSIS — Z794 Long term (current) use of insulin: Secondary | ICD-10-CM | POA: Diagnosis not present

## 2014-09-04 DIAGNOSIS — E780 Pure hypercholesterolemia: Secondary | ICD-10-CM | POA: Diagnosis not present

## 2014-09-04 DIAGNOSIS — R062 Wheezing: Secondary | ICD-10-CM

## 2014-09-04 DIAGNOSIS — H548 Legal blindness, as defined in USA: Secondary | ICD-10-CM | POA: Diagnosis not present

## 2014-09-04 DIAGNOSIS — I10 Essential (primary) hypertension: Secondary | ICD-10-CM | POA: Diagnosis not present

## 2014-09-04 DIAGNOSIS — R0602 Shortness of breath: Secondary | ICD-10-CM | POA: Diagnosis not present

## 2014-09-04 DIAGNOSIS — J45901 Unspecified asthma with (acute) exacerbation: Secondary | ICD-10-CM | POA: Insufficient documentation

## 2014-09-04 DIAGNOSIS — Z87891 Personal history of nicotine dependence: Secondary | ICD-10-CM | POA: Insufficient documentation

## 2014-09-04 DIAGNOSIS — R0789 Other chest pain: Secondary | ICD-10-CM | POA: Diagnosis not present

## 2014-09-04 DIAGNOSIS — Z862 Personal history of diseases of the blood and blood-forming organs and certain disorders involving the immune mechanism: Secondary | ICD-10-CM | POA: Diagnosis not present

## 2014-09-04 DIAGNOSIS — R079 Chest pain, unspecified: Secondary | ICD-10-CM | POA: Diagnosis present

## 2014-09-04 DIAGNOSIS — Z79899 Other long term (current) drug therapy: Secondary | ICD-10-CM | POA: Insufficient documentation

## 2014-09-04 DIAGNOSIS — E119 Type 2 diabetes mellitus without complications: Secondary | ICD-10-CM | POA: Diagnosis not present

## 2014-09-04 HISTORY — DX: Unspecified asthma, uncomplicated: J45.909

## 2014-09-04 LAB — CBC
HEMATOCRIT: 32.6 % — AB (ref 36.0–46.0)
HEMOGLOBIN: 10.4 g/dL — AB (ref 12.0–15.0)
MCH: 25.9 pg — ABNORMAL LOW (ref 26.0–34.0)
MCHC: 31.9 g/dL (ref 30.0–36.0)
MCV: 81.1 fL (ref 78.0–100.0)
Platelets: 269 10*3/uL (ref 150–400)
RBC: 4.02 MIL/uL (ref 3.87–5.11)
RDW: 14.6 % (ref 11.5–15.5)
WBC: 10 10*3/uL (ref 4.0–10.5)

## 2014-09-04 LAB — BASIC METABOLIC PANEL
Anion gap: 8 (ref 5–15)
BUN: 41 mg/dL — AB (ref 6–23)
CALCIUM: 9.1 mg/dL (ref 8.4–10.5)
CO2: 22 mmol/L (ref 19–32)
Chloride: 104 mmol/L (ref 96–112)
Creatinine, Ser: 1.89 mg/dL — ABNORMAL HIGH (ref 0.50–1.10)
GFR calc Af Amer: 35 mL/min — ABNORMAL LOW (ref >90)
GFR calc non Af Amer: 30 mL/min — ABNORMAL LOW (ref >90)
GLUCOSE: 326 mg/dL — AB (ref 70–99)
Potassium: 4.6 mmol/L (ref 3.5–5.1)
SODIUM: 134 mmol/L — AB (ref 135–145)

## 2014-09-04 LAB — CBG MONITORING, ED: Glucose-Capillary: 285 mg/dL — ABNORMAL HIGH (ref 70–99)

## 2014-09-04 LAB — I-STAT TROPONIN, ED: Troponin i, poc: 0 ng/mL (ref 0.00–0.08)

## 2014-09-04 LAB — BRAIN NATRIURETIC PEPTIDE: B Natriuretic Peptide: 38.8 pg/mL (ref 0.0–100.0)

## 2014-09-04 MED ORDER — ALBUTEROL SULFATE HFA 108 (90 BASE) MCG/ACT IN AERS
1.0000 | INHALATION_SPRAY | Freq: Four times a day (QID) | RESPIRATORY_TRACT | Status: DC | PRN
Start: 2014-09-04 — End: 2023-11-11

## 2014-09-04 MED ORDER — METHYLPREDNISOLONE SODIUM SUCC 125 MG IJ SOLR
125.0000 mg | Freq: Once | INTRAMUSCULAR | Status: AC
  Administered 2014-09-04: 125 mg via INTRAVENOUS
  Filled 2014-09-04: qty 2

## 2014-09-04 MED ORDER — IPRATROPIUM-ALBUTEROL 0.5-2.5 (3) MG/3ML IN SOLN
3.0000 mL | Freq: Once | RESPIRATORY_TRACT | Status: AC
  Administered 2014-09-04: 3 mL via RESPIRATORY_TRACT
  Filled 2014-09-04: qty 3

## 2014-09-04 MED ORDER — PREDNISONE 20 MG PO TABS: ORAL_TABLET | ORAL | Status: DC

## 2014-09-04 MED ORDER — SODIUM CHLORIDE 0.9 % IV BOLUS (SEPSIS): 1000.0000 mL | INTRAVENOUS | Status: DC

## 2014-09-04 NOTE — ED Notes (Signed)
Unsuccessful lab draw, not enough blood return

## 2014-09-04 NOTE — ED Provider Notes (Signed)
CSN: [id number]     Arrival date & time 09/04/14  1215 History   First MD Initiated Contact with Patient 09/04/14 1327     Chief Complaint  Patient presents with  . Chest Pain     (Consider location/radiation/quality/duration/timing/severity/associated sxs/prior Treatment) Patient is a 49 y.o. female presenting with chest pain. The history is provided by the patient.  Chest Pain Pain location:  L chest and R chest Pain quality: pressure   Pain radiates to:  Does not radiate Pain radiates to the back: no   Pain severity:  Mild Onset quality:  Sudden Timing:  Intermittent Progression:  Unchanged Chronicity:  New Context: breathing   Relieved by: inhaler. Associated symptoms: cough and shortness of breath   Associated symptoms: no abdominal pain, no back pain, no dizziness, no fatigue, no fever, no headache, no nausea and not vomiting   Shortness of breath:    Severity:  Mild   Onset quality:  Sudden   Timing:  Intermittent   Progression:  Waxing and waning   Past Medical History  Diagnosis Date  . Diabetes mellitus   . Legally blind   . Glaucoma   . Hypertension   . Hypercholesterolemia   . Sickle cell trait   . Asthma    Past Surgical History  Procedure Laterality Date  . Cholecystectomy    . Abdominal hysterectomy    . Cesarean section    . Cataract extraction, bilateral    . Stent eye    . Retinal detachment surgery      bilaterally   Family History  Problem Relation Age of Onset  . Diabetes Mother   . Heart failure Mother    History  Substance Use Topics  . Smoking status: Former Research scientist (life sciences)  . Smokeless tobacco: Not on file  . Alcohol Use: Yes     Comment: ocassionally   OB History    No data available     Review of Systems  Constitutional: Negative for fever and fatigue.  HENT: Negative for congestion and drooling.   Eyes: Negative for pain.  Respiratory: Positive for cough, chest tightness and shortness of breath.   Cardiovascular: Negative for  chest pain.  Gastrointestinal: Negative for nausea, vomiting, abdominal pain and diarrhea.  Genitourinary: Negative for dysuria and hematuria.  Musculoskeletal: Negative for back pain, gait problem and neck pain.  Skin: Negative for color change.  Neurological: Negative for dizziness and headaches.  Hematological: Negative for adenopathy.  Psychiatric/Behavioral: Negative for behavioral problems.  All other systems reviewed and are negative.     Allergies  Metformin and related  Home Medications   Prior to Admission medications   Medication Sig Start Date End Date Taking? Authorizing Provider  albuterol (PROVENTIL HFA;VENTOLIN HFA) 108 (90 BASE) MCG/ACT inhaler Inhale 2 puffs into the lungs every 4 (four) hours as needed for wheezing. 07/07/11 09/04/14 Yes Blair Heys, MD  canagliflozin (INVOKANA) 100 MG TABS tablet Take 100 mg by mouth daily.   Yes Historical Provider, MD  glimepiride (AMARYL) 4 MG tablet Take 4 mg by mouth daily before breakfast.     Yes Historical Provider, MD  HYDROcodone-acetaminophen (NORCO/VICODIN) 5-325 MG per tablet Take 1 tablet by mouth every 6 (six) hours as needed for moderate pain. 03/31/14  Yes Garald Balding, NP  insulin glargine (LANTUS) 100 UNIT/ML injection Inject 15-20 Units into the skin every morning. And 20 in am and 15 at bedtime   Yes Historical Provider, MD  insulin NPH Human (HUMULIN N,NOVOLIN N) 100  UNIT/ML injection Inject 2-10 Units into the skin 2 (two) times daily before a meal. 351-400    10 units      151-200    2 units   Yes Historical Provider, MD  simvastatin (ZOCOR) 10 MG tablet Take 10 mg by mouth at bedtime.     Yes Historical Provider, MD  valsartan-hydrochlorothiazide (DIOVAN-HCT) 160-12.5 MG per tablet Take 1 tablet by mouth daily.     Yes Historical Provider, MD  ketoconazole (NIZORAL) 2 % cream Apply 1 application topically daily. Patient not taking: Reported on 09/04/2014 02/14/14   Alvina Chou, PA-C  predniSONE (DELTASONE)  20 MG tablet Take 2 tablets (40 mg total) by mouth daily. Patient not taking: Reported on 09/04/2014 02/14/14   Alvina Chou, PA-C   BP 157/72 mmHg  Pulse 89  Temp(Src) 98.1 F (36.7 C) (Oral)  Resp 20  SpO2 98% Physical Exam  Constitutional: She is oriented to person, place, and time. She appears well-developed and well-nourished.  HENT:  Head: Normocephalic.  Mouth/Throat: Oropharynx is clear and moist. No oropharyngeal exudate.  Eyes: Conjunctivae and EOM are normal. Pupils are equal, round, and reactive to light.  Neck: Normal range of motion. Neck supple.  Cardiovascular: Normal rate, regular rhythm, normal heart sounds and intact distal pulses.  Exam reveals no gallop and no friction rub.   No murmur heard. Pulmonary/Chest: Effort normal. No respiratory distress. She has wheezes.  Mild inspiratory and expiratory wheezing diffusely.  Abdominal: Soft. Bowel sounds are normal. There is no tenderness. There is no rebound and no guarding.  Musculoskeletal: Normal range of motion. She exhibits no edema or tenderness.  Neurological: She is alert and oriented to person, place, and time.  Skin: Skin is warm and dry.  Psychiatric: She has a normal mood and affect. Her behavior is normal.  Nursing note and vitals reviewed.   ED Course  Procedures (including critical care time) Labs Review Labs Reviewed  CBC - Abnormal; Notable for the following:    Hemoglobin 10.4 (*)    HCT 32.6 (*)    MCH 25.9 (*)    All other components within normal limits  BASIC METABOLIC PANEL - Abnormal; Notable for the following:    Sodium 134 (*)    Glucose, Bld 326 (*)    BUN 41 (*)    Creatinine, Ser 1.89 (*)    GFR calc non Af Amer 30 (*)    GFR calc Af Amer 35 (*)    All other components within normal limits  CBG MONITORING, ED - Abnormal; Notable for the following:    Glucose-Capillary 285 (*)    All other components within normal limits  BRAIN NATRIURETIC PEPTIDE  I-STAT TROPOININ, ED     Imaging Review Dg Chest 2 View (if Patient Has Fever And/or Copd)  09/04/2014   CLINICAL DATA:  Shortness of breath, wheezing, chest pain.  EXAM: CHEST  2 VIEW  COMPARISON:  11/09/2013  FINDINGS: The heart size and mediastinal contours are within normal limits. Both lungs are clear. The visualized skeletal structures are unremarkable.  IMPRESSION: No active cardiopulmonary disease.   Electronically Signed   By: Rolm Baptise M.D.   On: 09/04/2014 13:57     EKG Interpretation   Date/Time:  Friday September 04 2014 12:21:18 EST Ventricular Rate:  90 PR Interval:  138 QRS Duration: 88 QT Interval:  348 QTC Calculation: 426 R Axis:   87 Text Interpretation:  Sinus rhythm Low voltage, precordial leads Baseline  wander in lead(s)  V1 V2 No significant change since last tracing Confirmed  by Winfred Leeds  MD, SAM [phone]) on 09/04/2014 12:25:46 PM      MDM   Final diagnoses:  Wheezing  SOB (shortness of breath)  Chest tightness    1:41 PM 49 y.o. female w hx of DM, HTN, HLP, former smoker who presents with shortness of breath and chest heaviness which began last night after her roommate fried some cinnamon in food she was making. The patient notes that it feels like asthma she has had in the past. She used an inhaler last night which helped. She had recurrence of her symptoms this morning which have persisted. She has both inspiratory and expiratory wheezes on exam. She denies any pain. We'll get screening labs and imaging. We'll give steroids and a nebulizer treatment.  3:24 PM: BS mildly elevated, pt states she took her insulin late today. Cr proximal to baseline. Normal ecg. Do not think this is ACS as pt notes her sx c/w previous wheezing/asthma. She did have improvement with a breathing treatment. I have discussed the diagnosis/risks/treatment options with the patient and believe the pt to be eligible for discharge home to follow-up with her pcp. We also discussed returning to the ED  immediately if new or worsening sx occur. We discussed the sx which are most concerning (e.g., worsening sob, cp, fever) that necessitate immediate return. Medications administered to the patient during their visit and any new prescriptions provided to the patient are listed below.  Medications given during this visit Medications  ipratropium-albuterol (DUONEB) 0.5-2.5 (3) MG/3ML nebulizer solution 3 mL (3 mLs Nebulization Given 09/04/14 1350)  methylPREDNISolone sodium succinate (SOLU-MEDROL) 125 mg/2 mL injection 125 mg (125 mg Intravenous Given 09/04/14 1350)    New Prescriptions   ALBUTEROL (PROVENTIL HFA;VENTOLIN HFA) 108 (90 BASE) MCG/ACT INHALER    Inhale 1-2 puffs into the lungs every 6 (six) hours as needed for wheezing or shortness of breath.   PREDNISONE (DELTASONE) 20 MG TABLET    Take 2 tablets by mouth on day 1 and 2. Take 1 tablet by mouth on day 3.     Pamella Pert, MD 09/04/14 1525

## 2014-09-04 NOTE — ED Notes (Signed)
Pt with Hx of asthma c/o tight chest pains onset last night, SOB.

## 2014-09-04 NOTE — Discharge Instructions (Signed)

## 2014-09-05 ENCOUNTER — Emergency Department (HOSPITAL_COMMUNITY)
Admission: EM | Admit: 2014-09-05 | Discharge: 2014-09-05 | Disposition: A | Discharge status: Home / Self Care | Payer: Medicare HMO | Attending: Emergency Medicine | Admitting: Emergency Medicine

## 2014-09-05 ENCOUNTER — Encounter (HOSPITAL_COMMUNITY): Payer: Self-pay | Admitting: Emergency Medicine

## 2014-09-05 DIAGNOSIS — Z862 Personal history of diseases of the blood and blood-forming organs and certain disorders involving the immune mechanism: Secondary | ICD-10-CM | POA: Diagnosis not present

## 2014-09-05 DIAGNOSIS — J4521 Mild intermittent asthma with (acute) exacerbation: Secondary | ICD-10-CM | POA: Insufficient documentation

## 2014-09-05 DIAGNOSIS — J45901 Unspecified asthma with (acute) exacerbation: Secondary | ICD-10-CM

## 2014-09-05 DIAGNOSIS — Z79899 Other long term (current) drug therapy: Secondary | ICD-10-CM | POA: Diagnosis not present

## 2014-09-05 DIAGNOSIS — E780 Pure hypercholesterolemia: Secondary | ICD-10-CM | POA: Insufficient documentation

## 2014-09-05 DIAGNOSIS — Z794 Long term (current) use of insulin: Secondary | ICD-10-CM | POA: Diagnosis not present

## 2014-09-05 DIAGNOSIS — Z8669 Personal history of other diseases of the nervous system and sense organs: Secondary | ICD-10-CM | POA: Diagnosis not present

## 2014-09-05 DIAGNOSIS — E119 Type 2 diabetes mellitus without complications: Secondary | ICD-10-CM | POA: Insufficient documentation

## 2014-09-05 DIAGNOSIS — R0789 Other chest pain: Secondary | ICD-10-CM | POA: Diagnosis present

## 2014-09-05 DIAGNOSIS — I10 Essential (primary) hypertension: Secondary | ICD-10-CM | POA: Diagnosis not present

## 2014-09-05 DIAGNOSIS — R7989 Other specified abnormal findings of blood chemistry: Secondary | ICD-10-CM | POA: Insufficient documentation

## 2014-09-05 MED ORDER — IPRATROPIUM-ALBUTEROL 0.5-2.5 (3) MG/3ML IN SOLN
3.0000 mL | Freq: Once | RESPIRATORY_TRACT | Status: AC
  Administered 2014-09-05: 3 mL via RESPIRATORY_TRACT
  Filled 2014-09-05 (×2): qty 3

## 2014-09-05 NOTE — ED Notes (Signed)
Pt arrived to the ED with a complaint of chest tightness and an asthma attack.  Pt was seen earlier today and was given a breathing treatment and sent home.  Pt states she took her prednisone and two treatments from her inhaler but received no relief of recurrent symptoms.  Pt has a cough.  Pt has inspiratory wheezing.

## 2014-09-05 NOTE — Discharge Instructions (Signed)
You may use 2 puffs of your albuterol inhaler every 4 hours as needed. Continue taking prednisone as prescribed by your previous visit. Have your kidney function rechecked by your primary care doctor. Return to the emergency department as needed if symptoms worsen.  Asthma, Acute Bronchospasm Acute bronchospasm caused by asthma is also referred to as an asthma attack. Bronchospasm means your air passages become narrowed. The narrowing is caused by inflammation and tightening of the muscles in the air tubes (bronchi) in your lungs. This can make it hard to breathe or cause you to wheeze and cough. CAUSES Possible triggers are:  Animal dander from the skin, hair, or feathers of animals.  Dust mites contained in house dust.  Cockroaches.  Pollen from trees or grass.  Mold.  Cigarette or tobacco smoke.  Air pollutants such as dust, household cleaners, hair sprays, aerosol sprays, paint fumes, strong chemicals, or strong odors.  Cold air or weather changes. Cold air may trigger inflammation. Winds increase molds and pollens in the air.  Strong emotions such as crying or laughing hard.  Stress.  Certain medicines such as aspirin or beta-blockers.  Sulfites in foods and drinks, such as dried fruits and wine.  Infections or inflammatory conditions, such as a flu, cold, or inflammation of the nasal membranes (rhinitis).  Gastroesophageal reflux disease (GERD). GERD is a condition where stomach acid backs up into your esophagus.  Exercise or strenuous activity. SIGNS AND SYMPTOMS   Wheezing.  Excessive coughing, particularly at night.  Chest tightness.  Shortness of breath. DIAGNOSIS  Your health care provider will ask you about your medical history and perform a physical exam. A chest X-ray or blood testing may be performed to look for other causes of your symptoms or other conditions that may have triggered your asthma attack. TREATMENT  Treatment is aimed at reducing  inflammation and opening up the airways in your lungs. Most asthma attacks are treated with inhaled medicines. These include quick relief or rescue medicines (such as bronchodilators) and controller medicines (such as inhaled corticosteroids). These medicines are sometimes given through an inhaler or a nebulizer. Systemic steroid medicine taken by mouth or given through an IV tube also can be used to reduce the inflammation when an attack is moderate or severe. Antibiotic medicines are only used if a bacterial infection is present.  HOME CARE INSTRUCTIONS   Rest.  Drink plenty of liquids. This helps the mucus to remain thin and be easily coughed up. Only use caffeine in moderation and do not use alcohol until you have recovered from your illness.  Do not smoke. Avoid being exposed to secondhand smoke.  You play a critical role in keeping yourself in good health. Avoid exposure to things that cause you to wheeze or to have breathing problems.  Keep your medicines up-to-date and available. Carefully follow your health care provider's treatment plan.  Take your medicine exactly as prescribed.  When pollen or pollution is bad, keep windows closed and use an air conditioner or go to places with air conditioning.  Asthma requires careful medical care. See your health care provider for a follow-up as advised. If you are more than [redacted] weeks pregnant and you were prescribed any new medicines, let your obstetrician know about the visit and how you are doing. Follow up with your health care provider as directed.  After you have recovered from your asthma attack, make an appointment with your outpatient doctor to talk about ways to reduce the likelihood of future attacks.  If you do not have a doctor who manages your asthma, make an appointment with a primary care doctor to discuss your asthma. SEEK IMMEDIATE MEDICAL CARE IF:   You are getting worse.  You have trouble breathing. If severe, call your local  emergency services (911 in the [place]  You develop chest pain or discomfort.  You are vomiting.  You are not able to keep fluids down.  You are coughing up yellow, green, brown, or bloody sputum.  You have a fever and your symptoms suddenly get worse.  You have trouble swallowing. MAKE SURE YOU:   Understand these instructions.  Will watch your condition.  Will get help right away if you are not doing well or get worse. Document Released: 11/01/2006 Document Revised: 07/22/2013 Document Reviewed: 01/22/2013 [facility] Patient Information 2015 [place], [place]. This information is not intended to replace advice given to you by your health care provider. Make sure you discuss any questions you have with your health care provider.
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
## 2015-04-17 ENCOUNTER — Encounter (HOSPITAL_COMMUNITY): Payer: Self-pay | Admitting: Emergency Medicine

## 2015-04-17 ENCOUNTER — Emergency Department (HOSPITAL_COMMUNITY)
Admission: EM | Admit: 2015-04-17 | Discharge: 2015-04-17 | Discharge status: Against Medical Advice | Payer: Medicare HMO | Attending: Emergency Medicine | Admitting: Emergency Medicine

## 2015-04-17 DIAGNOSIS — R51 Headache: Secondary | ICD-10-CM | POA: Diagnosis not present

## 2015-04-17 DIAGNOSIS — J45909 Unspecified asthma, uncomplicated: Secondary | ICD-10-CM | POA: Insufficient documentation

## 2015-04-17 DIAGNOSIS — E119 Type 2 diabetes mellitus without complications: Secondary | ICD-10-CM | POA: Diagnosis not present

## 2015-04-17 DIAGNOSIS — H548 Legal blindness, as defined in USA: Secondary | ICD-10-CM | POA: Insufficient documentation

## 2015-04-17 DIAGNOSIS — R110 Nausea: Secondary | ICD-10-CM | POA: Insufficient documentation

## 2015-04-17 DIAGNOSIS — I10 Essential (primary) hypertension: Secondary | ICD-10-CM | POA: Diagnosis not present

## 2015-04-17 NOTE — ED Notes (Addendum)
Pt has hx glaucoma. 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 Now having posterior head pain. Onset of sever pain was last night. Attempted using Advil and ice with no alleviation. Pain is central to left lower posterior head and neck. Says this almost feels like a pinched nerve. [facility] and was told to be seen here. No other c/c. Took 500 mg Robaxin within the past hour. 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 Patient is just concerned because at the time she lost her vision this is the same type of pain she experienced.

## 2015-04-17 NOTE — ED Notes (Signed)
Pt called to room x3. No sign of patient anywhere.

## 2015-06-14 ENCOUNTER — Emergency Department (HOSPITAL_COMMUNITY): Payer: Medicare HMO

## 2015-06-14 ENCOUNTER — Emergency Department (HOSPITAL_COMMUNITY)
Admission: EM | Admit: 2015-06-14 | Discharge: 2015-06-14 | Disposition: A | Discharge status: Home / Self Care | Payer: Medicare HMO | Attending: Emergency Medicine | Admitting: Emergency Medicine

## 2015-06-14 ENCOUNTER — Encounter (HOSPITAL_COMMUNITY): Payer: Self-pay | Admitting: Emergency Medicine

## 2015-06-14 DIAGNOSIS — M7918 Myalgia, other site: Secondary | ICD-10-CM

## 2015-06-14 DIAGNOSIS — Z862 Personal history of diseases of the blood and blood-forming organs and certain disorders involving the immune mechanism: Secondary | ICD-10-CM | POA: Insufficient documentation

## 2015-06-14 DIAGNOSIS — S79912A Unspecified injury of left hip, initial encounter: Secondary | ICD-10-CM | POA: Diagnosis not present

## 2015-06-14 DIAGNOSIS — S6991XA Unspecified injury of right wrist, hand and finger(s), initial encounter: Secondary | ICD-10-CM | POA: Insufficient documentation

## 2015-06-14 DIAGNOSIS — E119 Type 2 diabetes mellitus without complications: Secondary | ICD-10-CM | POA: Diagnosis not present

## 2015-06-14 DIAGNOSIS — E7800 Pure hypercholesterolemia, unspecified: Secondary | ICD-10-CM | POA: Insufficient documentation

## 2015-06-14 DIAGNOSIS — Z794 Long term (current) use of insulin: Secondary | ICD-10-CM | POA: Diagnosis not present

## 2015-06-14 DIAGNOSIS — Y9289 Other specified places as the place of occurrence of the external cause: Secondary | ICD-10-CM | POA: Diagnosis not present

## 2015-06-14 DIAGNOSIS — S3992XA Unspecified injury of lower back, initial encounter: Secondary | ICD-10-CM | POA: Insufficient documentation

## 2015-06-14 DIAGNOSIS — Z79899 Other long term (current) drug therapy: Secondary | ICD-10-CM | POA: Insufficient documentation

## 2015-06-14 DIAGNOSIS — S8002XA Contusion of left knee, initial encounter: Secondary | ICD-10-CM | POA: Diagnosis not present

## 2015-06-14 DIAGNOSIS — Z87891 Personal history of nicotine dependence: Secondary | ICD-10-CM | POA: Diagnosis not present

## 2015-06-14 DIAGNOSIS — Y9389 Activity, other specified: Secondary | ICD-10-CM | POA: Insufficient documentation

## 2015-06-14 DIAGNOSIS — Y998 Other external cause status: Secondary | ICD-10-CM | POA: Diagnosis not present

## 2015-06-14 DIAGNOSIS — H548 Legal blindness, as defined in USA: Secondary | ICD-10-CM | POA: Diagnosis not present

## 2015-06-14 DIAGNOSIS — R52 Pain, unspecified: Secondary | ICD-10-CM

## 2015-06-14 DIAGNOSIS — J45909 Unspecified asthma, uncomplicated: Secondary | ICD-10-CM | POA: Insufficient documentation

## 2015-06-14 DIAGNOSIS — S8992XA Unspecified injury of left lower leg, initial encounter: Secondary | ICD-10-CM | POA: Diagnosis present

## 2015-06-14 DIAGNOSIS — W108XXA Fall (on) (from) other stairs and steps, initial encounter: Secondary | ICD-10-CM | POA: Diagnosis not present

## 2015-06-14 DIAGNOSIS — W19XXXA Unspecified fall, initial encounter: Secondary | ICD-10-CM

## 2015-06-14 NOTE — ED Notes (Signed)
Patient reports falling down eight steps, denies lightheadedness or dizziness before fall, denies LOC, denies hitting head, c/o of left sided body pain and right hand. Pain greatest in left hip and knee. Rates pain 6/10. 500mg  tylenol approximately 0820 today.

## 2015-06-14 NOTE — ED Notes (Signed)
Denies LOC, HEAD OR NECK PAIN.

## 2015-06-14 NOTE — ED Notes (Signed)
Pt son at bedside. Ambulatory on crutches brought from home.

## 2015-06-14 NOTE — ED Provider Notes (Signed)
CSN: [id number]     Arrival date & time 06/14/15  1124 History   First MD Initiated Contact with Patient 06/14/15 1141     Chief Complaint  Patient presents with  . Fall  . Back Pain    HPI   49 year old female presents today status post fall. Patient reports that she was walking down a flight of stairs when she began to tumble, she reports this was a flight of stairs. She reports she was able to ambulate after the accident with minimal difficulty was able to walk across campus with only minor pain to her left knee. She reports she started developing into her right proximal hand, left anterior knee, left hip and lower lumbar spine. Patient reports the pain of the hand is sharp, worse with palpation and range of motion, denies loss of sensation strength or motor function. She reports the left knee pain is sharp, worse with flexion, she is able to flex knee to 90, she does have an abrasion to the left anterior knee. She reports the left hip pain is "sore, diffuse to the lateral aspect, along with lower lumbar tenderness with palpation and pain with forward flexion. Patient denies any loss of consciousness, she does have a visual impairment, she denies any headache, dizziness, shortness of breath status post fall. At time of evaluation patient denies any headache, neck pain, chest pain, shortness of breath, abdominal pain, or any other pain other than that noted. She denies any loss of distal sensation strength or motor function. Patient reports an antalgic gait on the left likely due to knee pain.  Past Medical History  Diagnosis Date  . Diabetes mellitus   . Legally blind   . Glaucoma   . Hypertension   . Hypercholesterolemia   . Sickle cell trait ([place])   . Asthma    Past Surgical History  Procedure Laterality Date  . Cholecystectomy    . Abdominal hysterectomy    . Cesarean section    . Cataract extraction, bilateral    . Stent eye    . Retinal detachment surgery      bilaterally    Family History  Problem Relation Age of Onset  . Diabetes Mother   . Heart failure Mother    Social History  Substance Use Topics  . Smoking status: Former Research scientist (life sciences)  . Smokeless tobacco: None  . Alcohol Use: Yes     Comment: ocassionally   OB History    No data available     Review of Systems  All other systems reviewed and are negative.   Allergies  Metformin and related  Home Medications   Prior to Admission medications   Medication Sig Start Date End Date Taking? Authorizing Provider  albuterol (PROVENTIL HFA;VENTOLIN HFA) 108 (90 BASE) MCG/ACT inhaler Inhale 2 puffs into the lungs every 4 (four) hours as needed for wheezing. 07/07/11 09/04/14  Blair Heys, MD  albuterol (PROVENTIL HFA;VENTOLIN HFA) 108 (90 BASE) MCG/ACT inhaler Inhale 1-2 puffs into the lungs every 6 (six) hours as needed for wheezing or shortness of breath. 09/04/14   Pamella Pert, MD  canagliflozin (INVOKANA) 100 MG TABS tablet Take 100 mg by mouth daily.    Historical Provider, MD  glimepiride (AMARYL) 4 MG tablet Take 4 mg by mouth daily before breakfast.      Historical Provider, MD  HYDROcodone-acetaminophen (NORCO/VICODIN) 5-325 MG per tablet Take 1 tablet by mouth every 6 (six) hours as needed for moderate pain. 03/31/14   [name], NP  insulin glargine (LANTUS) 100 UNIT/ML injection Inject 15-20 Units into the skin every morning. And 20 in am and 15 at bedtime    Historical Provider, MD  insulin NPH Human (HUMULIN N,NOVOLIN N) 100 UNIT/ML injection Inject 2-10 Units into the skin 2 (two) times daily before a meal. 351-400    10 units      151-200    2 units    Historical Provider, MD  ketoconazole (NIZORAL) 2 % cream Apply 1 application topically daily. Patient not taking: Reported on 09/04/2014 02/14/14   Alvina Chou, PA-C  predniSONE (DELTASONE) 20 MG tablet Take 2 tablets (40 mg total) by mouth daily. Patient not taking: Reported on 09/04/2014 02/14/14   Alvina Chou, PA-C  predniSONE  (DELTASONE) 20 MG tablet Take 2 tablets by mouth on day 1 and 2. Take 1 tablet by mouth on day 3. Patient not taking: Reported on 09/05/2014 09/05/14   Pamella Pert, MD  simvastatin (ZOCOR) 10 MG tablet Take 10 mg by mouth at bedtime.      Historical Provider, MD  valsartan-hydrochlorothiazide (DIOVAN-HCT) 160-12.5 MG per tablet Take 1 tablet by mouth daily.      Historical Provider, MD   BP 138/74 mmHg  Pulse 85  Temp(Src) 97.8 F (36.6 C) (Oral)  Resp 18  Wt 256 lb (116.121 kg)  SpO2 100%  LMP 09/05/2014   Physical Exam  Constitutional: She is oriented to person, place, and time. She appears well-developed and well-nourished.  HENT:  Head: Normocephalic and atraumatic.  Eyes: Conjunctivae are normal. Pupils are equal, round, and reactive to light. Right eye exhibits no discharge. Left eye exhibits no discharge. No scleral icterus.  Neck: Normal range of motion. No JVD present. No tracheal deviation present.  Cardiovascular: Regular rhythm, normal heart sounds and intact distal pulses.   Pulmonary/Chest: Effort normal and breath sounds normal. No stridor. No respiratory distress. She has no wheezes. She has no rales. She exhibits no tenderness.  Abdominal: Soft. She exhibits no distension and no mass. There is no tenderness. There is no rebound and no guarding.  Musculoskeletal: Normal range of motion. She exhibits tenderness. She exhibits no edema.  Pain to the right hand about the scaphoid and proximal thumb, full active range of motion, pain to palpation, no soft tissue injury noted. Pain to palpation of the right proximal humerus and shoulder, very minimal, full active range of motion. Pain to palpation of the left anterior knee, obvious abrasion, bruising, pain with flexion patient able to flex knee to 90, full extension., Pain to palpation of the left lateral hip, difficult exam due to body habitus, no obvious deformities, signs of trauma. Lower lumbar spine tender to palpation,  including surrounding soft tissue. Patient has atraumatic head, full active range of motion of the neck, nontender to palpation, no signs of trauma to the chest or abdomen, nontender to palpation, full active range of motion of bilateral shoulders elbows wrists and hands, sensation grossly intact, cap refill less than 3 seconds. Patient able to ambulate with some difficulty due to pain. Lower extremity sensation, strength, motor function intact. No CT spine tenderness  Neurological: She is alert and oriented to person, place, and time. Coordination normal.  Skin: Skin is warm and dry.  Psychiatric: She has a normal mood and affect. Her behavior is normal. Judgment and thought content normal.  Nursing note and vitals reviewed.   ED Course  Procedures (including critical care time) Labs Review Labs Reviewed - No data to display  Imaging  Review Dg Lumbar Spine Complete  06/14/2015  CLINICAL DATA:  Fall, injury, fall down steps EXAM: LUMBAR SPINE - COMPLETE 4+ VIEW COMPARISON:  None. FINDINGS: Five views of the lumbar spine submitted. No acute fracture or subluxation. Anterior spurring noted lower endplate of L2 and upper endplate of L4 vertebral body. Facet degenerative changes noted L4 and L5 level. Mild disc space flattening at L5-S1 level. IMPRESSION: No acute fracture or subluxation. Mild degenerative changes as described above. Electronically Signed   By: Lahoma Crocker M.D.   On: 06/14/2015 12:50   Dg Knee Complete 4 Views Left  06/14/2015  CLINICAL DATA:  Fall down steps, left knee pain EXAM: LEFT KNEE - COMPLETE 4+ VIEW COMPARISON:  None. FINDINGS: Four views of the left knee submitted. No acute fracture or subluxation. There is spurring of patella. Narrowing of medial joint compartment. IMPRESSION: No acute fracture or subluxation. Degenerative changes as described above. Electronically Signed   By: Lahoma Crocker M.D.   On: 06/14/2015 13:04   Dg Hand Complete Right  06/14/2015  CLINICAL DATA:   Fall down stairs, injury, low back pain, left hip pain, left knee pain, right thumb pain EXAM: RIGHT HAND - COMPLETE 3+ VIEW COMPARISON:  None. FINDINGS: Three views of the right hand submitted. No acute fracture or subluxation. No radiopaque foreign body. IMPRESSION: Negative. Electronically Signed   By: Lahoma Crocker M.D.   On: 06/14/2015 12:49   Dg Hip Unilat With Pelvis Min 4 Views Left  06/14/2015  CLINICAL DATA:  Fall down steps today, left hip pain EXAM: DG HIP (WITH OR WITHOUT PELVIS) 4+V LEFT COMPARISON:  None. FINDINGS: Three views of the left hip submitted. No acute fracture or subluxation. Mild degenerative changes are noted bilateral hip joints with superior acetabular spurring. IMPRESSION: No acute fracture or subluxation. Degenerative changes bilateral hip joints. Electronically Signed   By: Lahoma Crocker M.D.   On: 06/14/2015 12:51   I have personally reviewed and evaluated these images and lab results as part of my medical decision-making.   EKG Interpretation None      MDM   Final diagnoses:  Musculoskeletal pain  Fall, initial encounter    Labs:  Imaging: DG hand right, DG knee left, DG hip unilateral with pelvis, DG lumbar spine- no acute findings  Consults:  Therapeutics:  Discharge Meds:   Assessment/Plan: Patient presents status post fall, likely due to visual impairment. Patient had no loss of consciousness, she has no head pain neck pain, or any other concerning findings on physical exam that would necessitate further evaluation or management here in the ED setting. Plain film showed no acute findings, patient will be discharged home with instructions to use ice, Tylenol, follow-up with primary care if symptoms continue to persist. Patient was able to stand and pivot with short ambulation here in the ED. Patient verbalized understanding and agreement for today's plan and had no further questions or concerns at time of discharge         Okey Regal,  PA-C 06/14/15 [place], MD 06/14/15 [phone]

## 2015-06-14 NOTE — ED Notes (Signed)
PA at bedside.

## 2015-06-14 NOTE — Discharge Instructions (Signed)
Cryotherapy Cryotherapy is when you put ice on your injury. Ice helps lessen pain and puffiness (swelling) after an injury. Ice works the best when you start using it in the first 24 to 48 hours after an injury. HOME CARE  Put a dry or damp towel between the ice pack and your skin.  You may press gently on the ice pack.  Leave the ice on for no more than 10 to 20 minutes at a time.  Check your skin after 5 minutes to make sure your skin is okay.  Rest at least 20 minutes between ice pack uses.  Stop using ice when your skin loses feeling (numbness).  Do not use ice on someone who cannot tell you when it hurts. This includes small children and people with memory problems (dementia). GET HELP RIGHT AWAY IF:  You have white spots on your skin.  Your skin turns blue or pale.  Your skin feels waxy or hard.  Your puffiness gets worse. MAKE SURE YOU:   Understand these instructions.  Will watch your condition.  Will get help right away if you are not doing well or get worse.   This information is not intended to replace advice given to you by your health care provider. Make sure you discuss any questions you have with your health care provider.   Document Released: 01/03/2008 Document Revised: 10/09/2011 Document Reviewed: 03/09/2011 Elsevier Interactive Patient Education [employer].  Please follow-up with her primary care provider if symptoms persist or worsen. Please use Tylenol, ice, rest.

## 2015-06-14 NOTE — ED Notes (Signed)
Patient transported to X-ray 

## 2015-06-14 NOTE — ED Notes (Signed)
Patient's son is on the way, he is bringing her crutches

## 2016-09-15 ENCOUNTER — Encounter: Payer: Self-pay | Admitting: Gastroenterology

## 2016-10-23 ENCOUNTER — Ambulatory Visit: Payer: Medicare HMO | Admitting: Dietician

## 2016-11-17 ENCOUNTER — Encounter: Payer: Medicare HMO | Admitting: Gastroenterology

## 2016-12-30 ENCOUNTER — Encounter (HOSPITAL_COMMUNITY): Payer: Self-pay | Admitting: Emergency Medicine

## 2016-12-30 ENCOUNTER — Emergency Department (HOSPITAL_COMMUNITY): Payer: Medicare Other

## 2016-12-30 ENCOUNTER — Emergency Department (HOSPITAL_COMMUNITY)
Admission: EM | Admit: 2016-12-30 | Discharge: 2016-12-30 | Disposition: A | Discharge status: Home / Self Care | Payer: Medicare Other | Attending: Emergency Medicine | Admitting: Emergency Medicine

## 2016-12-30 DIAGNOSIS — M25462 Effusion, left knee: Secondary | ICD-10-CM | POA: Insufficient documentation

## 2016-12-30 DIAGNOSIS — M25561 Pain in right knee: Secondary | ICD-10-CM

## 2016-12-30 DIAGNOSIS — I10 Essential (primary) hypertension: Secondary | ICD-10-CM | POA: Insufficient documentation

## 2016-12-30 DIAGNOSIS — J45909 Unspecified asthma, uncomplicated: Secondary | ICD-10-CM | POA: Diagnosis not present

## 2016-12-30 DIAGNOSIS — Z79899 Other long term (current) drug therapy: Secondary | ICD-10-CM | POA: Diagnosis not present

## 2016-12-30 DIAGNOSIS — E119 Type 2 diabetes mellitus without complications: Secondary | ICD-10-CM | POA: Diagnosis not present

## 2016-12-30 DIAGNOSIS — Z87891 Personal history of nicotine dependence: Secondary | ICD-10-CM | POA: Diagnosis not present

## 2016-12-30 DIAGNOSIS — Z794 Long term (current) use of insulin: Secondary | ICD-10-CM | POA: Insufficient documentation

## 2016-12-30 DIAGNOSIS — M25562 Pain in left knee: Secondary | ICD-10-CM | POA: Insufficient documentation

## 2016-12-30 MED ORDER — HYDROCODONE-ACETAMINOPHEN 5-325 MG PO TABS: 2.0000 | ORAL_TABLET | ORAL | 0 refills | Status: DC | PRN

## 2016-12-30 NOTE — ED Provider Notes (Signed)
[place] DEPT Provider Note   CSN: [id number] Arrival date & time: 12/30/16  1427  By signing my name below, I, Theresia Bough, attest that this documentation has been prepared under the direction and in the presence of [employer], PA-C. Electronically Signed: Theresia Bough, ED Scribe. 12/30/16. 6:13 PM.  History   Chief Complaint Chief Complaint  Patient presents with  . Knee Pain   The history is provided by the patient. No language interpreter was used.   HPI Comments: Kirsten Shaffer is a 51 y.o. female with a PMHx of a knee scope, who presents to the Emergency Department complaining of gradually worsening left knee pain onset 2 weeks ago. Pt describes her pain as a cramping sensation and states that her knee "locked up" this morning. Pt states she has recently started exercising. Pt states pain is exacerbated by ambulation. Pt reports associated swelling to the area. Per pt, she has an appointment with her orthopedic doctor on 01/02/17.  No treatments tried prior to arrival in the ED. Pt denies fever, warmth to the area,  or any other complaints at this time.  Past Medical History:  Diagnosis Date  . Asthma   . Diabetes mellitus   . Glaucoma   . Hypercholesterolemia   . Hypertension   . Legally blind   . Sickle cell trait [facility])     Patient Active Problem List   Diagnosis Date Noted  . Lipoma of back 05/24/2012    Past Surgical History:  Procedure Laterality Date  . ABDOMINAL HYSTERECTOMY    . CATARACT EXTRACTION, BILATERAL    . CESAREAN SECTION    . CHOLECYSTECTOMY    . RETINAL DETACHMENT SURGERY     bilaterally  . stent eye      OB History    No data available       Home Medications    Prior to Admission medications   Medication Sig Start Date End Date Taking? Authorizing Provider  albuterol (PROVENTIL HFA;VENTOLIN HFA) 108 (90 BASE) MCG/ACT inhaler Inhale 2 puffs into the lungs every 4 (four) hours as needed for wheezing. 07/07/11 09/04/14  Blair Heys, MD  albuterol (PROVENTIL HFA;VENTOLIN HFA) 108 (90 BASE) MCG/ACT inhaler Inhale 1-2 puffs into the lungs every 6 (six) hours as needed for wheezing or shortness of breath. 09/04/14   Pamella Pert, MD  canagliflozin (INVOKANA) 100 MG TABS tablet Take 100 mg by mouth daily.    [provider]  glimepiride (AMARYL) 4 MG tablet Take 4 mg by mouth daily before breakfast.      [provider]  HYDROcodone-acetaminophen (NORCO/VICODIN) 5-325 MG tablet Take 2 tablets by mouth every 4 (four) hours as needed. 12/30/16   Shuntay Everetts, Dellis Filbert, PA-C  insulin glargine (LANTUS) 100 UNIT/ML injection Inject 15-20 Units into the skin every morning. And 20 in am and 15 at bedtime    [provider]  insulin NPH Human (HUMULIN N,NOVOLIN N) 100 UNIT/ML injection Inject 2-10 Units into the skin 2 (two) times daily before a meal. 351-400    10 units      151-200    2 units    [provider]  ketoconazole (NIZORAL) 2 % cream Apply 1 application topically daily. Patient not taking: Reported on 09/04/2014 02/14/14   Alvina Chou, PA-C  predniSONE (DELTASONE) 20 MG tablet Take 2 tablets (40 mg total) by mouth daily. Patient not taking: Reported on 09/04/2014 02/14/14   Alvina Chou, PA-C  predniSONE (DELTASONE) 20 MG tablet Take 2 tablets  by mouth on day 1 and 2. Take 1 tablet by mouth on day 3. Patient not taking: Reported on 09/05/2014 09/05/14   Pamella Pert, MD  simvastatin (ZOCOR) 10 MG tablet Take 10 mg by mouth at bedtime.      [provider]  valsartan-hydrochlorothiazide (DIOVAN-HCT) 160-12.5 MG per tablet Take 1 tablet by mouth daily.      [provider]    Family History Family History  Problem Relation Age of Onset  . Diabetes Mother   . Heart failure Mother     Social History Social History  Substance Use Topics  . Smoking status: Former Research scientist (life sciences)  . Smokeless tobacco: Not on file  . Alcohol use Yes     Comment: ocassionally      Allergies   Metformin and related   Review of Systems Review of Systems  Constitutional: Negative for fever.  Musculoskeletal: Positive for arthralgias, joint swelling and myalgias.  All other systems reviewed and are negative.    Physical Exam Updated Vital Signs BP (!) 153/78 (BP Location: Left Arm)   Pulse 87   Temp 98.6 F (37 C) (Oral)   Resp 16   LMP 09/05/2014   SpO2 100%   Physical Exam  Constitutional: She is oriented to person, place, and time. She appears well-developed and well-nourished.  HENT:  Head: Normocephalic and atraumatic.  Cardiovascular: Normal rate.   Pulmonary/Chest: Effort normal.  Musculoskeletal: She exhibits edema and tenderness.  Left knee: atraumatic, minor swelling noted. No warmth to touch. Unable to flex due to pain. TTP to the joint line.   Neurological: She is alert and oriented to person, place, and time.  Skin: Skin is warm and dry.  Psychiatric: She has a normal mood and affect.  Nursing note and vitals reviewed.   ED Treatments / Results  DIAGNOSTIC STUDIES: Oxygen Saturation is 100% on RA, normal by my interpretation.    Labs (all labs ordered are listed, but only abnormal results are displayed) Labs Reviewed - No data to display  EKG  EKG Interpretation None       Radiology Dg Knee Complete 4 Views Left  Result Date: 12/30/2016 CLINICAL DATA:  Left knee pain, no known injury, initial encounter EXAM: LEFT KNEE - COMPLETE 4+ VIEW COMPARISON:  06/14/2015 FINDINGS: No acute fracture or dislocation is noted. Mild medial joint space narrowing is noted. No joint effusion is seen. No gross soft tissue abnormality is noted. IMPRESSION: Mild degenerative change without acute abnormality. Electronically Signed   By: Inez Catalina M.D.   On: 12/30/2016 18:21    Procedures Procedures (including critical care time)  Medications Ordered in ED Medications - No data to display   Initial Impression / Assessment and Plan /  ED Course  I have reviewed the triage vital signs and the nursing notes.  Pertinent labs & imaging results that were available during my care of the patient were reviewed by me and considered in my medical decision making (see chart for details).      Final Clinical Impressions(s) / ED Diagnoses   Final diagnoses:  Acute pain of right knee   Labs:   Imaging: DG Knee Complete 4 Views Left  Consults:   Therapeutics:   Discharge Meds:Hydrocodone  Assessment/Plan: 51 year old female presents today with left knee pain. Patient does have very minor swelling, tenderness to palpation. She has a history of meniscal tears in the past and feels that this is very similar. Complete exam was not performed due to patient's  comfort level. She had no signs of infectious etiology, this is likely intra-articular. She has no significant fluid collection within the knee based off her plain films. Patient will be placed in knee immobilizer, given crutches, she has a follow-up appointment with her orthopedic surgeon Dr. Alma Friendly on June 11. She was given short course pain medication to help alleviate severe symptoms. She is encouraged continue using Tylenol or ibuprofen as needed for discomfort. Return precautions given. She verbalized understanding and agreement to today's plan had no further questions or concerns   New Prescriptions Discharge Medication List as of 12/30/2016  7:01 PM    I personally performed the services described in this documentation, which was scribed in my presence. The recorded information has been reviewed and is accurate.      Okey Regal, PA-C 12/30/16 2020    Carmin Muskrat, MD 12/31/16 Shelah Lewandowsky

## 2016-12-30 NOTE — Discharge Instructions (Signed)
Please read attached information. If you experience any new or worsening signs or symptoms please return to the emergency room for evaluation. Please follow-up with your primary care provider or specialist as discussed. Please use medication prescribed only as directed and discontinue taking if you have any concerning signs or symptoms.   °

## 2016-12-30 NOTE — ED Triage Notes (Signed)
Pt verbalizes acute chronic left knee pain worsening over past 2 weeks; hx of torn meniscus. No fever or redness.

## 2017-04-06 ENCOUNTER — Encounter (HOSPITAL_COMMUNITY): Payer: Self-pay

## 2017-04-06 ENCOUNTER — Emergency Department (HOSPITAL_COMMUNITY)
Admission: EM | Admit: 2017-04-06 | Discharge: 2017-04-06 | Disposition: A | Discharge status: Home / Self Care | Payer: Medicare Other | Attending: Emergency Medicine | Admitting: Emergency Medicine

## 2017-04-06 ENCOUNTER — Emergency Department (HOSPITAL_COMMUNITY): Payer: Medicare Other

## 2017-04-06 DIAGNOSIS — Z87891 Personal history of nicotine dependence: Secondary | ICD-10-CM | POA: Insufficient documentation

## 2017-04-06 DIAGNOSIS — I10 Essential (primary) hypertension: Secondary | ICD-10-CM | POA: Diagnosis not present

## 2017-04-06 DIAGNOSIS — Z794 Long term (current) use of insulin: Secondary | ICD-10-CM | POA: Insufficient documentation

## 2017-04-06 DIAGNOSIS — M25561 Pain in right knee: Secondary | ICD-10-CM | POA: Diagnosis present

## 2017-04-06 DIAGNOSIS — E119 Type 2 diabetes mellitus without complications: Secondary | ICD-10-CM | POA: Insufficient documentation

## 2017-04-06 DIAGNOSIS — J45909 Unspecified asthma, uncomplicated: Secondary | ICD-10-CM | POA: Insufficient documentation

## 2017-04-06 MED ORDER — HYDROCODONE-ACETAMINOPHEN 5-325 MG PO TABS: 1.0000 | ORAL_TABLET | ORAL | 0 refills | Status: DC | PRN

## 2017-04-06 MED ORDER — HYDROCODONE-ACETAMINOPHEN 5-325 MG PO TABS
1.0000 | ORAL_TABLET | Freq: Once | ORAL | Status: AC
  Administered 2017-04-06: 1 via ORAL
  Filled 2017-04-06: qty 1

## 2017-04-06 NOTE — ED Provider Notes (Signed)
[place] DEPT Provider Note   CSN: [id number] Arrival date & time: 04/06/17  1252     History   Chief Complaint Chief Complaint  Patient presents with  . Knee Pain    HPI Birttany AVEN Shaffer is a 51 y.o. female who presents with right knee pain. PMH significant for recent meniscus tear of the L knee s/p knee surgery in June 2018 by Dr. Veverly Fells. She states she has been favoring her right knee due to her recent surgery. She stepped down on a step toda and felt a pop and immediate onset of pain. Feels simillar to prior meniscus tear. The pain is on the inner side of the knee and radiates up the right thigh. She is able to bend her knee but has decreased ROM and has associated swelling and numbness. She denies any prior injuries or surgeries to the right knee. She was unyable to bear weight so came to the ED. She has already made an appt with Dr. Veverly Fells on 9/12.   HPI  Past Medical History:  Diagnosis Date  . Asthma   . Diabetes mellitus   . Glaucoma   . Hypercholesterolemia   . Hypertension   . Legally blind   . Sickle cell trait [facility])     Patient Active Problem List   Diagnosis Date Noted  . Lipoma of back 05/24/2012    Past Surgical History:  Procedure Laterality Date  . ABDOMINAL HYSTERECTOMY    . CATARACT EXTRACTION, BILATERAL    . CESAREAN SECTION    . CHOLECYSTECTOMY    . RETINAL DETACHMENT SURGERY     bilaterally  . stent eye      OB History    No data available       Home Medications    Prior to Admission medications   Medication Sig Start Date End Date Taking? Authorizing Provider  albuterol (PROVENTIL HFA;VENTOLIN HFA) 108 (90 BASE) MCG/ACT inhaler Inhale 2 puffs into the lungs every 4 (four) hours as needed for wheezing. 07/07/11 09/04/14  Blair Heys, MD  albuterol (PROVENTIL HFA;VENTOLIN HFA) 108 (90 BASE) MCG/ACT inhaler Inhale 1-2 puffs into the lungs every 6 (six) hours as needed for wheezing or shortness of breath. 09/04/14   Pamella Pert,  MD  canagliflozin (INVOKANA) 100 MG TABS tablet Take 100 mg by mouth daily.    [provider]  glimepiride (AMARYL) 4 MG tablet Take 4 mg by mouth daily before breakfast.      [provider]  HYDROcodone-acetaminophen (NORCO/VICODIN) 5-325 MG tablet Take 1-2 tablets by mouth every 4 (four) hours as needed. 04/06/17   Recardo Evangelist, PA-C  insulin glargine (LANTUS) 100 UNIT/ML injection Inject 15-20 Units into the skin every morning. And 20 in am and 15 at bedtime    [provider]  insulin NPH Human (HUMULIN N,NOVOLIN N) 100 UNIT/ML injection Inject 2-10 Units into the skin 2 (two) times daily before a meal. 351-400    10 units      151-200    2 units    [provider]  simvastatin (ZOCOR) 10 MG tablet Take 10 mg by mouth at bedtime.      [provider]  valsartan-hydrochlorothiazide (DIOVAN-HCT) 160-12.5 MG per tablet Take 1 tablet by mouth daily.      [provider]    Family History Family History  Problem Relation Age of Onset  . Diabetes Mother   . Heart failure Mother     Social History Social History  Substance Use Topics  . Smoking status: Former Research scientist (life sciences)  . Smokeless tobacco: Never Used  . Alcohol use Yes     Comment: ocassionally     Allergies   Metformin and related   Review of Systems Review of Systems  Musculoskeletal: Positive for arthralgias, joint swelling and myalgias.  Skin: Negative for wound.  Neurological: Positive for numbness. Negative for weakness.     Physical Exam Updated Vital Signs BP (!) 176/88   Pulse 83   Temp 98 F (36.7 C) (Oral)   Resp 16   Ht 5\' 6"  (1.676 m)   Wt 117.9 kg (260 lb)   LMP 09/05/2014   SpO2 100%   BMI 41.97 kg/m   Physical Exam  Constitutional: She is oriented to person, place, and time. She appears well-developed and well-nourished. No distress.  Pt is legally blind  HENT:  Head: Normocephalic and atraumatic.  Eyes: Pupils are equal, round, and  reactive to light. Conjunctivae are normal. Right eye exhibits no discharge. Left eye exhibits no discharge. No scleral icterus.  Neck: Normal range of motion.  Cardiovascular: Normal rate.   Pulmonary/Chest: Effort normal. No respiratory distress.  Abdominal: She exhibits no distension.  Musculoskeletal:  Right knee: No obvious swelling, deformity, or warmth. Tenderness to palpation of medial aspect of knee and medial thigh. ROM decreased due to pain.    Neurological: She is alert and oriented to person, place, and time.  Skin: Skin is warm and dry.  Psychiatric: She has a normal mood and affect. Her behavior is normal.  Nursing note and vitals reviewed.    ED Treatments / Results  Labs (all labs ordered are listed, but only abnormal results are displayed) Labs Reviewed - No data to display  EKG  EKG Interpretation None       Radiology Dg Knee Complete 4 Views Right  Result Date: 04/06/2017 CLINICAL DATA:  Pain following fall EXAM: RIGHT KNEE - COMPLETE 4+ VIEW COMPARISON:  None. FINDINGS: Frontal, lateral, and bilateral oblique views were obtained. There is no fracture or dislocation. No appreciable joint effusion. Joint spaces appear unremarkable. There are spurs along the superior and inferior aspects of the patella anteriorly. No erosive change. IMPRESSION: Spurring along the anterior patella, likely indicative of patellar and quadriceps tendinosis. No demonstrable fracture or joint effusion. No appreciable joint space narrowing. Electronically Signed   By: Lowella Grip III M.D.   On: 04/06/2017 15:46    Procedures Procedures (including critical care time)  Medications Ordered in ED Medications  HYDROcodone-acetaminophen (NORCO/VICODIN) 5-325 MG per tablet 1 tablet (1 tablet Oral Given 04/06/17 1520)     Initial Impression / Assessment and Plan / ED Course  I have reviewed the triage vital signs and the nursing notes.  Pertinent labs & imaging results that were  available during my care of the patient were reviewed by me and considered in my medical decision making (see chart for details).  51 year old female presents with right knee pain. Xray shows evidence of tendonitis but not bony abnormality. CD of imaging was given to patient along with report. She was also given pain medicine in the ED, knee immobilizer, crutches. She has a f/u appt with Dr. Veverly Fells already on Wednesday. She will follow up at that time. RICE protocol discussed. Return precautions given.  Final Clinical Impressions(s) / ED Diagnoses   Final diagnoses:  Acute pain of right knee    New Prescriptions New Prescriptions   HYDROCODONE-ACETAMINOPHEN (NORCO/VICODIN) 5-325 MG TABLET  Take 1-2 tablets by mouth every 4 (four) hours as needed.     Recardo Evangelist, PA-C 04/06/17 1610    Forde Dandy, MD 04/06/17 [phone]

## 2017-04-06 NOTE — ED Notes (Signed)
Bed: [facility] Expected date:  Expected time:  Means of arrival:  Comments: 

## 2017-04-06 NOTE — ED Triage Notes (Signed)
Patient c/o right knee pain. Patient states she had knee surgery on the left knee and has been using the right knee more often and when she stepped off of a step this AM she felt a pop in the right knee. Patient states she is unable to bear weight on her right knee.

## 2017-04-06 NOTE — Discharge Instructions (Signed)
Rest - please stay off right leg as much as possible Ice - ice for 20 minutes at a time, several times a day Compression - wear brace to provide support Elevate - elevate right leg above level of heart Ibuprofen - take with food. Take up to 3-4 times daily

## 2017-08-02 ENCOUNTER — Ambulatory Visit: Payer: Medicare Other | Admitting: Podiatry

## 2017-08-02 ENCOUNTER — Encounter: Payer: Self-pay | Admitting: Podiatry

## 2017-08-02 ENCOUNTER — Ambulatory Visit (INDEPENDENT_AMBULATORY_CARE_PROVIDER_SITE_OTHER): Payer: Medicare Other

## 2017-08-02 DIAGNOSIS — M7661 Achilles tendinitis, right leg: Secondary | ICD-10-CM | POA: Diagnosis not present

## 2017-08-02 DIAGNOSIS — M779 Enthesopathy, unspecified: Secondary | ICD-10-CM

## 2017-08-02 DIAGNOSIS — M7662 Achilles tendinitis, left leg: Secondary | ICD-10-CM | POA: Diagnosis not present

## 2017-08-02 MED ORDER — TRIAMCINOLONE ACETONIDE 10 MG/ML IJ SUSP
10.0000 mg | Freq: Once | INTRAMUSCULAR | Status: AC
  Administered 2017-08-02: 10 mg

## 2017-08-02 NOTE — Patient Instructions (Signed)

## 2017-08-02 NOTE — Progress Notes (Signed)
   Subjective:    Patient ID: Kirsten Shaffer, female    DOB: May 28, 1966, 52 y.o.   MRN: [id number]  HPI    Review of Systems  All other systems reviewed and are negative.      Objective:   Physical Exam        Assessment & Plan:

## 2017-08-02 NOTE — Progress Notes (Signed)
Subjective:   Patient ID: Kirsten Shaffer, female   DOB: 52 y.o.   MRN: [id number]   HPI Patient presents with a lot of discomfort in the posterior heel stating the right is worse than the left and is making it hard for her to be active.  Patient states is been hurting about 4 months and she is tried to stretch and use Aleve.  Patient does not smoke and likes to be active   Review of Systems  All other systems reviewed and are negative.       Objective:  Physical Exam  Constitutional: She appears well-developed and well-nourished.  Cardiovascular: Intact distal pulses.  Pulmonary/Chest: Effort normal.  Musculoskeletal: Normal range of motion.  Neurological: She is alert.  Skin: Skin is warm.  Nursing note and vitals reviewed.   Neurovascular status intact muscle strength adequate range of motion within normal limits with no equinus condition noted.  Patient does have diabetes that is under good control with I history of problems and does have discomfort in the posterior heel lateral side with essentially medial heel doing well.  Patient was noted to have good digital perfusion and well oriented x3 with mild discomfort in the left heel     Assessment:  Inflammatory tendinitis posterior lateral right heel with mild symptoms on the left with obesity and diabetes complicating factors     Plan:  H&P condition reviewed and x-rays reviewed.  I recommended careful injection I did explain risk of this with chances for rupture patient wants procedure and I did careful lateral injection 3 mg dexamethasone Kenalog 5 mg Xylocaine and then applied air fracture walker to completely immobilize the posterior Achilles tendon heel.  Patient will be seen back in 3 weeks or earlier if needed and is instructed on reducing activity  X-rays indicate posterior spur formation bilateral posterior heel

## 2017-08-16 ENCOUNTER — Telehealth: Payer: Self-pay | Admitting: Podiatry

## 2017-08-16 NOTE — Telephone Encounter (Signed)
I'm a patient of Dr. Mellody Drown and the number you can contact me at is [phone]. Thank you.

## 2017-08-16 NOTE — Telephone Encounter (Signed)
I saw Dr. Paulla Dolly two weeks ago and he told me if I ended up in severe pain again to call the office. I'm trying to reach someone to find out some information. I've called once and the nurse has not returned my call. I need to know if I need to go to the emergency room or not. My number is [phone].

## 2017-08-16 NOTE — Telephone Encounter (Signed)
I spoke with Kirsten Shaffer and she states she now has swelling and pain in the ankle. I asked Kirsten Shaffer if she had an injury and she states she heard a pop when she was going to class. I told Kirsten Shaffer to stay in the boot and transferred to schedulers to come in tomorrow to see Dr. Paulla Dolly.

## 2017-08-17 ENCOUNTER — Ambulatory Visit (INDEPENDENT_AMBULATORY_CARE_PROVIDER_SITE_OTHER): Payer: Medicare Other | Admitting: Podiatry

## 2017-08-17 ENCOUNTER — Ambulatory Visit (INDEPENDENT_AMBULATORY_CARE_PROVIDER_SITE_OTHER): Payer: Medicare Other

## 2017-08-17 ENCOUNTER — Other Ambulatory Visit: Payer: Self-pay | Admitting: Podiatry

## 2017-08-17 DIAGNOSIS — M79671 Pain in right foot: Secondary | ICD-10-CM | POA: Diagnosis not present

## 2017-08-17 DIAGNOSIS — M7662 Achilles tendinitis, left leg: Secondary | ICD-10-CM

## 2017-08-17 DIAGNOSIS — M779 Enthesopathy, unspecified: Secondary | ICD-10-CM | POA: Diagnosis not present

## 2017-08-17 DIAGNOSIS — M7661 Achilles tendinitis, right leg: Secondary | ICD-10-CM

## 2017-08-20 NOTE — Progress Notes (Signed)
Subjective:   Patient ID: Kirsten Shaffer, female   DOB: 52 y.o.   MRN: [id number]   HPI Patient presents stating she was walking on her foot she kind of stumbled and it started to hurt bad and she just want to make sure it was okay   ROS      Objective:  Physical Exam  Neurovascular status intact with patient's right foot showing posterior discomfort with currently no indication of a tear of the tendon itself.  Patient does have edema in this area and she did have a stumbling event that occurred     Assessment:  Trauma to the right foot with patient not being very stable with inflammation with possibility of tendon tear or bony injury     Plan:  H&P x-rays reviewed condition discussed and advised on continued immobilization ice therapy and stretching exercises.  Patient will be seen back if symptoms do not get better over the next few weeks or earlier if any other issues should occur.  I checked the Achilles tendon and found to be functioning normally  X-rays were negative but there is a tear in this area or a bone spur that has dislodged

## 2017-08-23 ENCOUNTER — Ambulatory Visit: Payer: Medicare Other | Admitting: Podiatry

## 2017-08-24 ENCOUNTER — Ambulatory Visit
Admission: RE | Admit: 2017-08-24 | Discharge: 2017-08-24 | Disposition: A | Discharge status: Home / Self Care | Payer: Medicare HMO | Source: Ambulatory Visit | Attending: Podiatry | Admitting: Podiatry

## 2017-08-24 DIAGNOSIS — M79671 Pain in right foot: Secondary | ICD-10-CM

## 2017-10-08 ENCOUNTER — Emergency Department (HOSPITAL_COMMUNITY): Payer: Medicare Other

## 2017-10-08 ENCOUNTER — Encounter (HOSPITAL_COMMUNITY): Payer: Self-pay | Admitting: Emergency Medicine

## 2017-10-08 ENCOUNTER — Emergency Department (HOSPITAL_COMMUNITY)
Admission: EM | Admit: 2017-10-08 | Discharge: 2017-10-09 | Disposition: A | Discharge status: Home / Self Care | Payer: Medicare Other | Attending: Emergency Medicine | Admitting: Emergency Medicine

## 2017-10-08 DIAGNOSIS — R0600 Dyspnea, unspecified: Secondary | ICD-10-CM

## 2017-10-08 DIAGNOSIS — E119 Type 2 diabetes mellitus without complications: Secondary | ICD-10-CM | POA: Insufficient documentation

## 2017-10-08 DIAGNOSIS — I10 Essential (primary) hypertension: Secondary | ICD-10-CM | POA: Insufficient documentation

## 2017-10-08 DIAGNOSIS — R0609 Other forms of dyspnea: Secondary | ICD-10-CM | POA: Insufficient documentation

## 2017-10-08 DIAGNOSIS — J45909 Unspecified asthma, uncomplicated: Secondary | ICD-10-CM | POA: Insufficient documentation

## 2017-10-08 DIAGNOSIS — D573 Sickle-cell trait: Secondary | ICD-10-CM | POA: Insufficient documentation

## 2017-10-08 DIAGNOSIS — J019 Acute sinusitis, unspecified: Secondary | ICD-10-CM | POA: Insufficient documentation

## 2017-10-08 DIAGNOSIS — J329 Chronic sinusitis, unspecified: Secondary | ICD-10-CM

## 2017-10-08 DIAGNOSIS — R42 Dizziness and giddiness: Secondary | ICD-10-CM

## 2017-10-08 DIAGNOSIS — Z87891 Personal history of nicotine dependence: Secondary | ICD-10-CM | POA: Insufficient documentation

## 2017-10-08 DIAGNOSIS — Z9049 Acquired absence of other specified parts of digestive tract: Secondary | ICD-10-CM | POA: Diagnosis not present

## 2017-10-08 DIAGNOSIS — Z794 Long term (current) use of insulin: Secondary | ICD-10-CM | POA: Diagnosis not present

## 2017-10-08 DIAGNOSIS — Z79899 Other long term (current) drug therapy: Secondary | ICD-10-CM | POA: Diagnosis not present

## 2017-10-08 DIAGNOSIS — I951 Orthostatic hypotension: Secondary | ICD-10-CM | POA: Insufficient documentation

## 2017-10-08 LAB — CBC
HEMATOCRIT: 34.9 % — AB (ref 36.0–46.0)
HEMOGLOBIN: 11.2 g/dL — AB (ref 12.0–15.0)
MCH: 25.7 pg — ABNORMAL LOW (ref 26.0–34.0)
MCHC: 32.1 g/dL (ref 30.0–36.0)
MCV: 80.2 fL (ref 78.0–100.0)
Platelets: 276 10*3/uL (ref 150–400)
RBC: 4.35 MIL/uL (ref 3.87–5.11)
RDW: 15.2 % (ref 11.5–15.5)
WBC: 11.5 10*3/uL — ABNORMAL HIGH (ref 4.0–10.5)

## 2017-10-08 LAB — BASIC METABOLIC PANEL
ANION GAP: 8 (ref 5–15)
BUN: 31 mg/dL — ABNORMAL HIGH (ref 6–20)
CHLORIDE: 106 mmol/L (ref 101–111)
CO2: 25 mmol/L (ref 22–32)
Calcium: 9.1 mg/dL (ref 8.9–10.3)
Creatinine, Ser: 1.62 mg/dL — ABNORMAL HIGH (ref 0.44–1.00)
GFR calc Af Amer: 41 mL/min — ABNORMAL LOW (ref >60)
GFR, EST NON AFRICAN AMERICAN: 36 mL/min — AB (ref >60)
GLUCOSE: 153 mg/dL — AB (ref 65–99)
POTASSIUM: 4.2 mmol/L (ref 3.5–5.1)
Sodium: 139 mmol/L (ref 135–145)

## 2017-10-08 LAB — CBG MONITORING, ED: Glucose-Capillary: 149 mg/dL — ABNORMAL HIGH (ref 65–99)

## 2017-10-08 LAB — URINALYSIS, ROUTINE W REFLEX MICROSCOPIC
BILIRUBIN URINE: NEGATIVE
Glucose, UA: NEGATIVE mg/dL
Hgb urine dipstick: NEGATIVE
Ketones, ur: NEGATIVE mg/dL
NITRITE: NEGATIVE
Protein, ur: NEGATIVE mg/dL
SPECIFIC GRAVITY, URINE: 1.012 (ref 1.005–1.030)
pH: 5 (ref 5.0–8.0)

## 2017-10-08 LAB — I-STAT BETA HCG BLOOD, ED (MC, WL, AP ONLY)

## 2017-10-08 MED ORDER — SODIUM CHLORIDE 0.9 % IV BOLUS (SEPSIS)
1000.0000 mL | Freq: Once | INTRAVENOUS | Status: AC
  Administered 2017-10-08: 1000 mL via INTRAVENOUS

## 2017-10-08 MED ORDER — LORAZEPAM 2 MG/ML IJ SOLN
1.0000 mg | Freq: Once | INTRAMUSCULAR | Status: AC
  Administered 2017-10-08: 1 mg via INTRAVENOUS
  Filled 2017-10-08: qty 1

## 2017-10-08 MED ORDER — TETRACAINE HCL 0.5 % OP SOLN
1.0000 [drp] | Freq: Once | OPHTHALMIC | Status: AC
  Administered 2017-10-09: 1 [drp] via OPHTHALMIC
  Filled 2017-10-08: qty 4

## 2017-10-08 NOTE — Discharge Instructions (Addendum)
You were given a prescription for antibiotics. Please take the antibiotic prescription fully.  If you have any adverse reactions to this antibiotic please stop taking it immediately.  Please check with your pharmacist to make sure there are no interactions with any other drugs you are taking.  You will need to stay hydrated and make sure that you are eating and drinking more than usual for the next several days.  You will need to obtain a new glucose monitor. You have been given a prescription for one. You need to follow-up with your primary care doctor within 3 days for reevaluation and to check to make sure that your symptoms are resolving. You should also follow-up with your ophthalmologist within the next 1-2 weeks for recheck of your vision in the right eye.  Please return to the emergency department for any new or worsening symptoms including any persistent lightheadedness when he stands up, chest pain, shortness of breath, headaches, fevers.

## 2017-10-08 NOTE — ED Notes (Signed)
Patient transported to MRI 

## 2017-10-08 NOTE — ED Triage Notes (Signed)
Pt has been having issues with fatigue and feeling off balance when she gets up to ambulate. Reports that her CBG has been high past couple days.

## 2017-10-08 NOTE — ED Provider Notes (Addendum)
[facility] DEPT Provider Note   CSN: [id number] Arrival date & time: 10/08/17  1305     History   Chief Complaint Chief Complaint  Patient presents with  . Fatigue  . Hyperglycemia    HPI Kirsten Shaffer is a 52 y.o. female.  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  Lightheadedness is worse when she is standing, not worse when turning her head side to side.  She also complains of hyperglycemia that has been ongoing for the same time period.  Patient states that blood sugars at home have been measuring in the 400s.  States she takes 50 units insulin in a.m., and 20 units in p.m.  Due to high readings, she increased night dose to 50 units for the last several days.  She notes that last night around 3 AM she had an episode of diaphoresis, clamminess, and felt hypoglycemic however her blood sugar at that time measured 280.   She also reports nausea and one episode of vomiting at that time.  Denies any chest pain or shortness of breath when this occurred. She has had no continued abdominal pain, vomiting, diarrhea, fevers, chest pain, or shortness of breath.  No urinary symptoms. Since increasing units to 50 she has had some dyspnea on exertion as well. No recent cough, congestion.  Denies any ear fullness, rhinorrhea, congestion, sore throat, sinus pressure.   Has had pain to right calf for several weeks s/p fall and "pop" sensation. Was seen by ortho and dx with tendonitis. Denies leg edema/redness, hemoptysis, recent surgery/trauma, recent long travel, hormone use, personal hx of cancer, or hx of DVT/PE.   Past Medical History:  Diagnosis Date  . Asthma   . Diabetes mellitus   . Glaucoma   .  Hypercholesterolemia   . Hypertension   . Legally blind   . Sickle cell trait [facility])     Patient Active Problem List   Diagnosis Date Noted  . Lipoma of back 05/24/2012    Past Surgical History:  Procedure Laterality Date  . ABDOMINAL HYSTERECTOMY    . CATARACT EXTRACTION, BILATERAL    . CESAREAN SECTION    . CHOLECYSTECTOMY    . RETINAL DETACHMENT SURGERY     bilaterally  . stent eye      OB History    No data available       Home Medications    Prior to Admission medications   Medication Sig Start Date End Date Taking? Authorizing Provider  glimepiride (AMARYL) 4 MG tablet Take 4 mg by mouth daily before breakfast.     Yes [provider]  hydrochlorothiazide (HYDRODIURIL) 12.5 MG tablet Take 12.5 mg by mouth daily. 09/07/17  Yes [provider]  insulin NPH Human (HUMULIN N,NOVOLIN N) 100 UNIT/ML injection Inject 2-10 Units into the skin 2 (two) times daily as needed (high blood sugar). 351-400    10 units      151-200    2 units    Yes [provider]  LANTUS SOLOSTAR 100 UNIT/ML Solostar Pen INJECT 50 UNITS EVERY MORNING AND 20 UNITS EVERY EVENING AT BEDTIME 10/02/17  Yes [provider]  Semaglutide (OZEMPIC) 0.25 or 0.5 MG/DOSE SOPN Inject 0.5 mg into the skin once a week. Every Wednesday.  Yes [provider]  simvastatin (ZOCOR) 40 MG tablet Take 40 mg by mouth at bedtime. 09/13/17  Yes [provider]  albuterol (PROVENTIL HFA;VENTOLIN HFA) 108 (90 BASE) MCG/ACT inhaler Inhale 2 puffs into the lungs every 4 (four) hours as needed for wheezing. 07/07/11 09/04/14  Blair Heys, MD  albuterol (PROVENTIL HFA;VENTOLIN HFA) 108 (90 BASE) MCG/ACT inhaler Inhale 1-2 puffs into the lungs every 6 (six) hours as needed for wheezing or shortness of breath. 09/04/14   Pamella Pert, MD  amoxicillin-clavulanate (AUGMENTIN) 875-125 MG tablet Take 1 tablet by mouth every 12 (twelve) hours for 7 days. 10/09/17 10/16/17  Lander Eslick  S, PA-C  amphetamine-dextroamphetamine (ADDERALL XR) 30 MG 24 hr capsule Take 30 mg by mouth daily as needed (adhd).  09/13/17   [provider]  HYDROcodone-acetaminophen (NORCO/VICODIN) 5-325 MG tablet Take 1-2 tablets by mouth every 4 (four) hours as needed. Patient not taking: Reported on 10/08/2017 04/06/17   Recardo Evangelist, PA-C    Family History Family History  Problem Relation Age of Onset  . Diabetes Mother   . Heart failure Mother     Social History Social History   Tobacco Use  . Smoking status: Former Research scientist (life sciences)  . Smokeless tobacco: Never Used  Substance Use Topics  . Alcohol use: Yes    Comment: ocassionally  . Drug use: No     Allergies   Metformin and related   Review of Systems Review of Systems  Constitutional: Positive for diaphoresis. Negative for fever.  HENT: Negative for congestion, ear pain, sinus pressure, sinus pain and sore throat.   Respiratory: Negative for cough and wheezing.        Dyspnea on exertion  Cardiovascular: Negative for chest pain, palpitations and leg swelling.  Gastrointestinal: Positive for constipation (chronic), nausea and vomiting. Negative for abdominal pain, blood in stool and diarrhea.  Genitourinary: Negative for dysuria, flank pain, frequency, pelvic pain and urgency.  Musculoskeletal: Negative for back pain and neck pain.  Skin: Negative for rash.  Neurological: Negative for dizziness, speech difficulty, weakness, light-headedness and numbness.       Ataxia, lightheaded/dizzness     Physical Exam Updated Vital Signs BP (!) 157/75   Pulse 86   Temp 98.4 F (36.9 C) (Oral)   Resp 17   LMP 09/05/2014   SpO2 97%   Physical Exam  Constitutional: She appears well-developed and well-nourished. No distress.  HENT:  Head: Normocephalic and atraumatic.  Right Ear: External ear normal.  Left Ear: External ear normal.  Nose: Nose normal.  Mouth/Throat: Oropharynx is clear and moist.  Bilateral TMs normal  with no effusions.  Eyes: Conjunctivae are normal.  Left pupil fixed slightly dilated.  Right  is pupil round reactive to light.  Extraocular movements intact bilaterally.  Central vision grossly intact to right eye.  Peripheral vision decreased in right eye which is chronic.  Left eye chronic blindness. Tonometry WNL: 14.  Neck: Normal range of motion. Neck supple. No JVD present.  No nuchal rigidity.  Cardiovascular: Normal rate, regular rhythm, normal heart sounds and intact distal pulses.  No murmur heard. Pulmonary/Chest: Effort normal and breath sounds normal. No respiratory distress. She has no wheezes.  Abdominal: Soft. Bowel sounds are normal. She exhibits no distension. There is no tenderness.  Musculoskeletal: She exhibits no edema.  No edema or erythema to bilat calves. TTP diffusely to right calf (pt has tendonitis). No palpable cord.  Neurological: She is alert.  Mental Status:  Alert, thought  content appropriate, able to give a coherent history. Speech fluent without evidence of aphasia. Able to follow 2 step commands without difficulty.  Cranial Nerves:  II: see ENT exam III,IV, VI: extra-ocular motions intact bilaterally  V,VII: smile symmetric, facial light touch sensation equal VIII: hearing grossly normal to voice  X: uvula elevates symmetrically  XI: bilateral shoulder shrug symmetric and strong XII: midline tongue extension without fassiculations Motor:  Normal tone. 5/5 strength of BUE and BLE major muscle groups including strong and equal grip strength and dorsiflexion/plantar flexion Sensory: light touch normal in all extremities. DTRs: patellar and achilles DTRs 2+ symmetric b/l Cerebellar: normal finger-to-nose with bilateral upper extremities Gait: did not test as pt does not feel steady CV: 2+ radial and DP/PT pulses +romburg  Skin: Skin is warm and dry.  Psychiatric: She has a normal mood and affect.  Nursing note and vitals reviewed.    ED Treatments  / Results  Labs (all labs ordered are listed, but only abnormal results are displayed) Labs Reviewed  BASIC METABOLIC PANEL - Abnormal; Notable for the following components:      Result Value   Glucose, Bld 153 (*)    BUN 31 (*)    Creatinine, Ser 1.62 (*)    GFR calc non Af Amer 36 (*)    GFR calc Af Amer 41 (*)    All other components within normal limits  CBC - Abnormal; Notable for the following components:   WBC 11.5 (*)    Hemoglobin 11.2 (*)    HCT 34.9 (*)    MCH 25.7 (*)    All other components within normal limits  URINALYSIS, ROUTINE W REFLEX MICROSCOPIC - Abnormal; Notable for the following components:   Leukocytes, UA TRACE (*)    Bacteria, UA MANY (*)    Squamous Epithelial / LPF 0-5 (*)    All other components within normal limits  CBG MONITORING, ED - Abnormal; Notable for the following components:   Glucose-Capillary 149 (*)    All other components within normal limits  URINE CULTURE  BRAIN NATRIURETIC PEPTIDE  CBG MONITORING, ED  I-STAT BETA HCG BLOOD, ED ([facility], [facility], [facility] ONLY)  CBG MONITORING, ED    EKG  EKG Interpretation None       Radiology Mr Brain Wo Contrast  Result Date: 10/08/2017 CLINICAL DATA:  52 y/o F; fatigue and feeling of imbalance. Ataxia, stroke suspected. EXAM: MRI HEAD WITHOUT CONTRAST TECHNIQUE: Multiplanar, multiecho pulse sequences of the brain and surrounding structures were obtained without intravenous contrast. COMPARISON:  02/29/2008 CT of the head. FINDINGS: Brain: No acute infarction, hemorrhage, hydrocephalus, extra-axial collection or mass lesion. Few nonspecific foci of T2 FLAIR hyperintense signal abnormality in subcortical white matter and pons are compatible with mild chronic microvascular ischemic changes. Vascular: Normal flow voids. Skull and upper cervical spine: Normal marrow signal. Sinuses/Orbits: Right intra-ocular lens replacement. Left phthisis bulbi or globe replacement. Mild anterior ethmoid sinus mucosal  thickening. No additional abnormal signal of paranasal sinuses or mastoid air cells. Other: None. IMPRESSION: 1. No acute intracranial abnormality identified. 2. Mild anterior ethmoid sinus disease. 3. Mild chronic microvascular ischemic changes of the brain. Electronically Signed   By: Kristine Garbe M.D.   On: 10/08/2017 23:35    Procedures Procedures (including critical care time)  Medications Ordered in ED Medications  sodium chloride 0.9 % bolus 500 mL (500 mLs Intravenous New Bag/Given 10/09/17 0117)  tetracaine (PONTOCAINE) 0.5 % ophthalmic solution 1 drop (1 drop Right Eye Given by  Other 10/09/17 0005)  sodium chloride 0.9 % bolus 1,000 mL (0 mLs Intravenous Stopped 10/09/17 0054)  LORazepam (ATIVAN) injection 1 mg (1 mg Intravenous Given 10/08/17 2230)     Initial Impression / Assessment and Plan / ED Course  I have reviewed the triage vital signs and the nursing notes.  Pertinent labs & imaging results that were available during my care of the patient were reviewed by me and considered in my medical decision making (see chart for details).    Discussed pt presentation and exam findings with Dr. Ellender Hose, who personally evaluated the patient and believe the patient needs to get an MRI based on positive Romberg and patient was past-pointing with finger to nose on the right.  Pt very claustraphobic and requesting sedation. Ordered 1mg  Ativan for pt. Pt was given 1.5mg  ativan and upon return from MRI is sleeping in room. She is arousable to voice and is alert once aroused.    Final Clinical Impressions(s) / ED Diagnoses   Final diagnoses:  Sinusitis, unspecified chronicity, unspecified location  Orthostatic hypotension  Lightheaded   52 year old female history of diabetes presenting with positional lightheadedness and hyperglycemia for 1 week.  Had recently increased p.m. insulin by 25 units, then began to have dyspnea on exertion for the last 2 days.  No associated cough,  no shortness of breath at rest, no chest pain.  No fevers.  Patient vital signs overall stable here, with mild hypertension she states is chronic for her.  Has not had hypertensive meds in about 1 week.  Patient had orthostatic hypotension documented in the ED.  Patient had positive Romberg test on neurologic exam but otherwise had no abnormalities on my exam.  Dr. Ellender Hose evaluated patient and she had past-pointing with finger to nose on the right side and MRI was felt to be necessary to rule out cerebellar stroke.  MRI negative for acute infarct, did show chronic microvascular changes as well as suggestive of sinusitis.  Will treat sinusitis with Augmentin.  Lab work significant for elevated BUN to 31, likely indicating dehydration.  Creatinine at 1.62, which appears to be similar to patient's baseline.  No electro light abnormalities.  CBC with mild leukocytosis to 11.5.  Slight anemia which appears improved from baseline.  UA with trace leukocytes but no evidence of infection.  No ketonuria.  Suspect that patient's symptoms are related to hypoglycemia given patient's recent increase in her insulin. Suspect that patients glucose monitor is not calibrating correctly as patient noted to have blood sugar of 149 in the ED initially.  She checked blood sugar with her machine simultaneously and her machine read 380. Suspect fatigue on exertion could be related to this however will order CXR and BNP to rule out CHF.  Patient has a history of this and does not appear to be fluid overloaded on exam.  Normal pulmonary exam. ECG NSR HR 86. No ischemic changes or arrhythmia.  Pt care signed out to Celedonio Savage, PA-C with plan to follow up on CXR, BNP. If WNL, pt can be discharged with outpatient follow-up once she is able to drink and ambulate independently.   Discussed plan with patient and her son who understand the patient needs to take antibiotic, follow-up with her PCP, follow-up with ophthalmology, and  return to the emergency department for any new or worsening symptoms.  All questions answered and patient and her son understand the plan.  ED Discharge Orders        Ordered  amoxicillin-clavulanate (AUGMENTIN) 875-125 MG tablet  Every 12 hours     10/09/17 0019       Rodney Booze, PA-C 10/09/17 0127    [place] S, PA-C 10/09/17 0305    Duffy Bruce, MD 10/09/17 1122

## 2017-10-09 ENCOUNTER — Emergency Department (HOSPITAL_COMMUNITY): Payer: Medicare Other

## 2017-10-09 LAB — CBG MONITORING, ED
GLUCOSE-CAPILLARY: 78 mg/dL (ref 65–99)
GLUCOSE-CAPILLARY: 61 mg/dL — AB (ref 65–99)
GLUCOSE-CAPILLARY: 88 mg/dL (ref 65–99)
GLUCOSE-CAPILLARY: 127 mg/dL — AB (ref 65–99)
Glucose-Capillary: 135 mg/dL — ABNORMAL HIGH (ref 65–99)

## 2017-10-09 LAB — BRAIN NATRIURETIC PEPTIDE: B NATRIURETIC PEPTIDE 5: 24.8 pg/mL (ref 0.0–100.0)

## 2017-10-09 MED ORDER — AMOXICILLIN-POT CLAVULANATE 875-125 MG PO TABS: 1.0000 | ORAL_TABLET | Freq: Two times a day (BID) | ORAL | 0 refills | Status: AC

## 2017-10-09 MED ORDER — SODIUM CHLORIDE 0.9 % IV BOLUS (SEPSIS)
500.0000 mL | Freq: Once | INTRAVENOUS | Status: AC
  Administered 2017-10-09: 500 mL via INTRAVENOUS

## 2017-10-09 MED ORDER — BLOOD GLUCOSE MONITOR KIT: PACK | 0 refills | Status: DC

## 2017-10-09 NOTE — ED Provider Notes (Signed)
Care assumed from previous provider PA Couture. Please see their note for further details to include full history and physical. To summarize in short pt is a 52 year old female with history of diabetes presents the ED with positional lightheadedness and hyperglycemia with exertional dyspnea. Case discussed, plan agreed upon.  Awaiting BNP and chest x-ray reveal any signs of acute heart failure.  BNP was normal.  Chest x-ray shows no pulmonary edema.  Patient's blood sugar shows hypoglycemia.  Likely due to her increased insulin as her monitor has not been calibrated correctly and patient has been increasing her insulin today.  Patient was watched several hours in the ED.  Her blood sugar has remained stable after p.o. intake.  Last blood sugar was 135.  Patient sleeping on my examination.  Upon waking she feels much improved.  Has tolerated p.o. fluids.  Ambulate with nursing states that she ambulated without any difficulty.  Patient feels much improved and asking for work note.  Have given her antibiotics as discussed with prior provider in their note.  Pt is hemodynamically stable, in NAD, & able to ambulate in the ED. Evaluation does not show pathology that would require ongoing emergent intervention or inpatient treatment. I explained the diagnosis to the patient. Pain has been managed & has no complaints prior to dc. Pt is comfortable with above plan and is stable for discharge at this time. All questions were answered prior to disposition. Strict return precautions for f/u to the ED were discussed. Encouraged follow up with PCP.        Doristine Devoid, PA-C 10/09/17 0539    Duffy Bruce, MD 10/09/17 1120

## 2017-10-09 NOTE — ED Notes (Signed)
Patient provided with a [place] sandwich and orange juice

## 2017-10-09 NOTE — ED Notes (Signed)
Patient transported to X-ray 

## 2017-10-11 LAB — URINE CULTURE

## 2017-10-12 ENCOUNTER — Telehealth: Payer: Self-pay | Admitting: *Deleted

## 2017-10-12 NOTE — Telephone Encounter (Signed)
Post ED Visit - Positive Culture Follow-up: Unsuccessful Patient Follow-up  Culture assessed and recommendations reviewed by:  []  Elenor Quinones, Pharm.D. []  Heide Guile, Pharm.D., BCPS AQ-ID []  Parks Neptune, Pharm.D., BCPS []  Alycia Rossetti, Pharm.D., BCPS []  [place], Pharm.D., BCPS, AAHIVP []  Legrand Como, Pharm.D., BCPS, AAHIVP []  Salome Arnt, PharmD, BCPS []  Dimitri Ped, PharmD, BCPS []  Vincenza Hews, PharmD, BCPS  Positive urine culture, reviewed by Celestia Khat, PA-C  []  Patient discharged without antimicrobial prescription and treatment is now indicated [x]  Organism is resistant to prescribed ED discharge antimicrobial []  Patient with positive blood cultures   Unable to contact patient after 3 attempts, letter will be sent to address on file  Ardeen Fillers 10/12/2017, 10:44 AM

## 2017-10-12 NOTE — Progress Notes (Signed)
ED Antimicrobial Stewardship Positive Culture Follow Up   Kirsten Shaffer is an 52 y.o. female who presented to [facility] on 10/08/2017 with a chief complaint of  Chief Complaint  Patient presents with  . Fatigue  . Hyperglycemia    Recent Results (from the past 720 hour(s))  Urine culture     Status: Abnormal   Collection Time: 10/08/17 10:26 PM  Result Value Ref Range Status   Specimen Description   Final    URINE, CLEAN CATCH Performed at [facility] [address], [place], [place] [address]    Special Requests   Final    NONE Performed at [facility], [place] [address], [place], [place] [address]    Culture >=100,000 COLONIES/mL ESCHERICHIA COLI (A)  Final   Report Status 10/11/2017 FINAL  Final   Organism ID, Bacteria ESCHERICHIA COLI (A)  Final      Susceptibility   Escherichia coli - MIC*    AMPICILLIN >=32 RESISTANT Resistant     CEFAZOLIN 32 INTERMEDIATE Intermediate     CEFTRIAXONE <=1 SENSITIVE Sensitive     CIPROFLOXACIN <=0.25 SENSITIVE Sensitive     GENTAMICIN <=1 SENSITIVE Sensitive     IMIPENEM <=0.25 SENSITIVE Sensitive     NITROFURANTOIN 32 SENSITIVE Sensitive     TRIMETH/SULFA >=320 RESISTANT Resistant     AMPICILLIN/SULBACTAM >=32 RESISTANT Resistant     PIP/TAZO >=128 RESISTANT Resistant     Extended ESBL NEGATIVE Sensitive     * >=100,000 COLONIES/mL ESCHERICHIA COLI   No abx   ED Provider: Ardyth Harps, PA-C  Wynell Balloon 10/12/2017, 8:55 AM Infectious Diseases Pharmacist Phone# [phone]

## 2018-01-27 ENCOUNTER — Encounter (HOSPITAL_COMMUNITY): Payer: Self-pay

## 2018-01-27 ENCOUNTER — Other Ambulatory Visit: Payer: Self-pay

## 2018-01-27 ENCOUNTER — Emergency Department (HOSPITAL_COMMUNITY)
Admission: EM | Admit: 2018-01-27 | Discharge: 2018-01-28 | Disposition: A | Discharge status: Home / Self Care | Payer: Medicare Other | Attending: Emergency Medicine | Admitting: Emergency Medicine

## 2018-01-27 DIAGNOSIS — J45909 Unspecified asthma, uncomplicated: Secondary | ICD-10-CM | POA: Insufficient documentation

## 2018-01-27 DIAGNOSIS — E119 Type 2 diabetes mellitus without complications: Secondary | ICD-10-CM | POA: Diagnosis not present

## 2018-01-27 DIAGNOSIS — Z79899 Other long term (current) drug therapy: Secondary | ICD-10-CM | POA: Insufficient documentation

## 2018-01-27 DIAGNOSIS — Z794 Long term (current) use of insulin: Secondary | ICD-10-CM | POA: Diagnosis not present

## 2018-01-27 DIAGNOSIS — Z87891 Personal history of nicotine dependence: Secondary | ICD-10-CM | POA: Diagnosis not present

## 2018-01-27 DIAGNOSIS — T50901A Poisoning by unspecified drugs, medicaments and biological substances, accidental (unintentional), initial encounter: Secondary | ICD-10-CM | POA: Diagnosis present

## 2018-01-27 LAB — CBG MONITORING, ED: GLUCOSE-CAPILLARY: 112 mg/dL — AB (ref 70–99)

## 2018-01-27 NOTE — ED Triage Notes (Signed)
Pt reports that she accidentally took 38 units of Novolog when she meant to take 40 units of Lantus. She has been monitoring her sugars since and states that she thinks that her sugar should be higher based on what she has been eating. She is now complaining of headache and drowsiness. She states, "I feel shitty."

## 2018-01-28 LAB — BASIC METABOLIC PANEL
Anion gap: 7 (ref 5–15)
BUN: 44 mg/dL — AB (ref 6–20)
CO2: 23 mmol/L (ref 22–32)
Calcium: 9.1 mg/dL (ref 8.9–10.3)
Chloride: 108 mmol/L (ref 98–111)
Creatinine, Ser: 1.69 mg/dL — ABNORMAL HIGH (ref 0.44–1.00)
GFR calc Af Amer: 39 mL/min — ABNORMAL LOW (ref >60)
GFR, EST NON AFRICAN AMERICAN: 34 mL/min — AB (ref >60)
GLUCOSE: 235 mg/dL — AB (ref 70–99)
POTASSIUM: 4.3 mmol/L (ref 3.5–5.1)
Sodium: 138 mmol/L (ref 135–145)

## 2018-01-28 LAB — CBC WITH DIFFERENTIAL/PLATELET
Basophils Absolute: 0 10*3/uL (ref 0.0–0.1)
Basophils Relative: 0 %
EOS PCT: 3 %
Eosinophils Absolute: 0.3 10*3/uL (ref 0.0–0.7)
HCT: 32.2 % — ABNORMAL LOW (ref 36.0–46.0)
Hemoglobin: 10.4 g/dL — ABNORMAL LOW (ref 12.0–15.0)
LYMPHS PCT: 40 %
Lymphs Abs: 4 10*3/uL (ref 0.7–4.0)
MCH: 25.7 pg — AB (ref 26.0–34.0)
MCHC: 32.3 g/dL (ref 30.0–36.0)
MCV: 79.7 fL (ref 78.0–100.0)
Monocytes Absolute: 0.5 10*3/uL (ref 0.1–1.0)
Monocytes Relative: 5 %
Neutro Abs: 5.2 10*3/uL (ref 1.7–7.7)
Neutrophils Relative %: 52 %
PLATELETS: 275 10*3/uL (ref 150–400)
RBC: 4.04 MIL/uL (ref 3.87–5.11)
RDW: 15.3 % (ref 11.5–15.5)
WBC: 10.1 10*3/uL (ref 4.0–10.5)

## 2018-01-28 LAB — CBG MONITORING, ED: GLUCOSE-CAPILLARY: 182 mg/dL — AB (ref 70–99)

## 2018-01-28 MED ORDER — ONDANSETRON 4 MG PO TBDP
4.0000 mg | ORAL_TABLET | Freq: Once | ORAL | Status: AC
  Administered 2018-01-28: 4 mg via ORAL
  Filled 2018-01-28: qty 1

## 2018-01-28 NOTE — Discharge Instructions (Signed)
In the morning, resume your normal dosing of insulin. Follow-up with your primary care doctor. Return here for any new/acute changes.

## 2018-01-28 NOTE — ED Provider Notes (Signed)
[facility] DEPT Provider Note   CSN: [id number] Arrival date & time: 01/27/18  2242     History   Chief Complaint Chief Complaint  Patient presents with  . Medication Problem    HPI Kirsten Shaffer is a 52 y.o. female.  The history is provided by the patient and medical records.     52 year old female with history of asthma, diabetes, glaucoma, hyperlipidemia, hypertension, legal blindness, sickle cell trait, presenting to the ED after accidentally mixing of her medications.  Patient states around 5:45 PM she was sitting on the couch watching TV and about to take her 40 units of evening Lantus, however accidentally took 40 units of her NovoLog.  States when she realized what she had done she has been monitoring her blood sugar closely, she did eat 2 hotdogs and a small doughnut around 7 PM.  States her blood sugars were initially high in the 300s, drop down into the 90s but then started going back up.  States she called poison control because she was concerned and they recommended she be evaluated here in the ED.  States overall she just feels crummy.  She is somewhat nauseated and feels a little fatigued.  Does report she missed her nighttime dose of Lantus last night because she was out having drinks with some of her friends.  States this is why she thinks her blood sugar was elevated earlier today.  CBGs here have been stable thus far.  Past Medical History:  Diagnosis Date  . Asthma   . Diabetes mellitus   . Glaucoma   . Hypercholesterolemia   . Hypertension   . Legally blind   . Sickle cell trait [facility])     Patient Active Problem List   Diagnosis Date Noted  . Lipoma of back 05/24/2012    Past Surgical History:  Procedure Laterality Date  . ABDOMINAL HYSTERECTOMY    . CATARACT EXTRACTION, BILATERAL    . CESAREAN SECTION    . CHOLECYSTECTOMY    . RETINAL DETACHMENT SURGERY     bilaterally  . stent eye       OB History   None       Home Medications    Prior to Admission medications   Medication Sig Start Date End Date Taking? Authorizing Provider  albuterol (PROVENTIL HFA;VENTOLIN HFA) 108 (90 BASE) MCG/ACT inhaler Inhale 2 puffs into the lungs every 4 (four) hours as needed for wheezing. 07/07/11 09/04/14  Blair Heys, MD  albuterol (PROVENTIL HFA;VENTOLIN HFA) 108 (90 BASE) MCG/ACT inhaler Inhale 1-2 puffs into the lungs every 6 (six) hours as needed for wheezing or shortness of breath. 09/04/14   Pamella Pert, MD  amphetamine-dextroamphetamine (ADDERALL XR) 30 MG 24 hr capsule Take 30 mg by mouth daily as needed (adhd).  09/13/17   [provider]  blood glucose meter kit and supplies KIT Dispense based on patient and insurance preference. Use up to four times daily as directed. (FOR ICD-9 250.00, 250.01). 10/09/17   Couture, Cortni S, PA-C  glimepiride (AMARYL) 4 MG tablet Take 4 mg by mouth daily before breakfast.      [provider]  hydrochlorothiazide (HYDRODIURIL) 12.5 MG tablet Take 12.5 mg by mouth daily. 09/07/17   [provider]  HYDROcodone-acetaminophen (NORCO/VICODIN) 5-325 MG tablet Take 1-2 tablets by mouth every 4 (four) hours as needed. Patient not taking: Reported on 10/08/2017 04/06/17   Recardo Evangelist, PA-C  insulin NPH Human (HUMULIN N,NOVOLIN N) 100 UNIT/ML injection  Inject 2-10 Units into the skin 2 (two) times daily as needed (high blood sugar). 351-400    10 units      151-200    2 units     [provider]  LANTUS SOLOSTAR 100 UNIT/ML Solostar Pen INJECT 50 UNITS EVERY MORNING AND 20 UNITS EVERY EVENING AT BEDTIME 10/02/17   [provider]  Semaglutide (OZEMPIC) 0.25 or 0.5 MG/DOSE SOPN Inject 0.5 mg into the skin once a week. Every Wednesday.    [provider]  simvastatin (ZOCOR) 40 MG tablet Take 40 mg by mouth at bedtime. 09/13/17   [provider]    Family History Family History  Problem Relation Age of Onset  .  Diabetes Mother   . Heart failure Mother     Social History Social History   Tobacco Use  . Smoking status: Former Research scientist (life sciences)  . Smokeless tobacco: Never Used  Substance Use Topics  . Alcohol use: Yes    Comment: ocassionally  . Drug use: No     Allergies   Metformin and related   Review of Systems Review of Systems  Gastrointestinal: Positive for nausea.  All other systems reviewed and are negative.    Physical Exam Updated Vital Signs BP (!) 146/71   Pulse 84   Temp 98.3 F (36.8 C) (Oral)   Resp 16   LMP 09/05/2014   SpO2 98%   Physical Exam  Constitutional: She is oriented to person, place, and time. She appears well-developed and well-nourished.  HENT:  Head: Normocephalic and atraumatic.  Mouth/Throat: Oropharynx is clear and moist.  Eyes: Pupils are equal, round, and reactive to light. Conjunctivae and EOM are normal.  Neck: Normal range of motion.  Cardiovascular: Normal rate, regular rhythm and normal heart sounds.  Pulmonary/Chest: Effort normal and breath sounds normal. No stridor. No respiratory distress.  Abdominal: Soft. Bowel sounds are normal. There is no tenderness. There is no rebound.  Musculoskeletal: Normal range of motion.  Neurological: She is alert and oriented to person, place, and time.  Skin: Skin is warm and dry.  Psychiatric: She has a normal mood and affect.  Nursing note and vitals reviewed.    ED Treatments / Results  Labs (all labs ordered are listed, but only abnormal results are displayed) Labs Reviewed  CBC WITH DIFFERENTIAL/PLATELET - Abnormal; Notable for the following components:      Result Value   Hemoglobin 10.4 (*)    HCT 32.2 (*)    MCH 25.7 (*)    All other components within normal limits  BASIC METABOLIC PANEL - Abnormal; Notable for the following components:   Glucose, Bld 235 (*)    BUN 44 (*)    Creatinine, Ser 1.69 (*)    GFR calc non Af Amer 34 (*)    GFR calc Af Amer 39 (*)    All other components  within normal limits  CBG MONITORING, ED - Abnormal; Notable for the following components:   Glucose-Capillary 112 (*)    All other components within normal limits  CBG MONITORING, ED - Abnormal; Notable for the following components:   Glucose-Capillary 182 (*)    All other components within normal limits    EKG None  Radiology No results found.  Procedures Procedures (including critical care time)  Medications Ordered in ED Medications  ondansetron (ZOFRAN-ODT) disintegrating tablet 4 mg (4 mg Oral Given 01/28/18 0137)     Initial Impression / Assessment and Plan / ED Course  I have  reviewed the triage vital signs and the nursing notes.  Pertinent labs & imaging results that were available during my care of the patient were reviewed by me and considered in my medical decision making (see chart for details).  52 year old female presenting to the ED after accidentally giving herself 40 units of her NovoLog instead of her 40 units of Lantus.  She did this around 5:45 PM.  States she kept a log of her blood sugar since that time and has had some fluctuations.  She called poison control who recommended she come to the ED for evaluation.  Here she is awake, alert, appropriately oriented.  Blood sugars have been overall stable.  States she just feels nauseated at this time.  Basic labs were sent and are overall reassuring.  Nausea relieved with Zofran, tolerating ginger ale without issue.  Blood sugar remained stable without any hypoglycemic events.  She is stable for discharge home.  We will have her resume her normal dosing of insulin in the morning.  She will follow-up closely with her primary care doctor.  She understands to return here for any new or worsening symptoms.  Final Clinical Impressions(s) / ED Diagnoses   Final diagnoses:  Accidental medication error, initial encounter    ED Discharge Orders    None       Larene Pickett, PA-C 01/28/18 [place], Ankit,  MD 02/05/18 [phone]

## 2018-03-23 ENCOUNTER — Other Ambulatory Visit: Payer: Self-pay | Admitting: Internal Medicine

## 2018-03-23 DIAGNOSIS — E2839 Other primary ovarian failure: Secondary | ICD-10-CM

## 2018-05-27 ENCOUNTER — Other Ambulatory Visit: Payer: Self-pay | Admitting: Internal Medicine

## 2018-05-27 DIAGNOSIS — Z1231 Encounter for screening mammogram for malignant neoplasm of breast: Secondary | ICD-10-CM

## 2018-05-28 ENCOUNTER — Ambulatory Visit
Admission: RE | Admit: 2018-05-28 | Discharge: 2018-05-28 | Disposition: A | Discharge status: Home / Self Care | Payer: Medicare Other | Source: Ambulatory Visit | Attending: Internal Medicine | Admitting: Internal Medicine

## 2018-05-28 DIAGNOSIS — E2839 Other primary ovarian failure: Secondary | ICD-10-CM

## 2018-06-13 ENCOUNTER — Inpatient Hospital Stay: Admission: RE | Admit: 2018-06-13 | Payer: Medicare Other | Source: Ambulatory Visit

## 2018-09-24 ENCOUNTER — Encounter (HOSPITAL_COMMUNITY): Payer: Self-pay | Admitting: Psychiatry

## 2018-09-24 ENCOUNTER — Ambulatory Visit (INDEPENDENT_AMBULATORY_CARE_PROVIDER_SITE_OTHER): Payer: Medicare Other | Admitting: Psychiatry

## 2018-09-24 ENCOUNTER — Other Ambulatory Visit (HOSPITAL_COMMUNITY): Payer: Self-pay | Admitting: Psychiatry

## 2018-09-24 VITALS — BP 130/80 | HR 99 | Ht 66.0 in | Wt 269.0 lb

## 2018-09-24 DIAGNOSIS — F411 Generalized anxiety disorder: Secondary | ICD-10-CM

## 2018-09-24 DIAGNOSIS — F331 Major depressive disorder, recurrent, moderate: Secondary | ICD-10-CM

## 2018-09-24 MED ORDER — SERTRALINE HCL 50 MG PO TABS: 50.0000 mg | ORAL_TABLET | Freq: Every day | ORAL | 0 refills | Status: DC
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
## 2018-10-14 ENCOUNTER — Other Ambulatory Visit (HOSPITAL_COMMUNITY): Payer: Self-pay | Admitting: Psychiatry

## 2018-10-28 ENCOUNTER — Ambulatory Visit (HOSPITAL_COMMUNITY): Payer: Medicare Other | Admitting: Psychiatry

## 2019-04-17 ENCOUNTER — Other Ambulatory Visit: Payer: Self-pay | Admitting: *Deleted

## 2019-04-17 DIAGNOSIS — Z1231 Encounter for screening mammogram for malignant neoplasm of breast: Secondary | ICD-10-CM

## 2019-08-26 ENCOUNTER — Ambulatory Visit: Payer: Medicare Other | Attending: Internal Medicine

## 2019-08-26 DIAGNOSIS — Z20822 Contact with and (suspected) exposure to covid-19: Secondary | ICD-10-CM

## 2019-08-27 LAB — NOVEL CORONAVIRUS, NAA: SARS-CoV-2, NAA: NOT DETECTED

## 2020-01-08 ENCOUNTER — Other Ambulatory Visit: Payer: Self-pay | Admitting: Family Medicine

## 2020-01-08 DIAGNOSIS — I739 Peripheral vascular disease, unspecified: Secondary | ICD-10-CM

## 2020-01-14 ENCOUNTER — Ambulatory Visit
Admission: RE | Admit: 2020-01-14 | Discharge: 2020-01-14 | Disposition: A | Discharge status: Home / Self Care | Payer: Medicare Other | Source: Ambulatory Visit | Attending: Family Medicine | Admitting: Family Medicine

## 2020-01-14 DIAGNOSIS — I739 Peripheral vascular disease, unspecified: Secondary | ICD-10-CM

## 2020-03-04 ENCOUNTER — Emergency Department (HOSPITAL_COMMUNITY)
Admission: EM | Admit: 2020-03-04 | Discharge: 2020-03-05 | Disposition: A | Discharge status: Home / Self Care | Payer: Medicare Other | Source: Home / Self Care

## 2020-03-04 DIAGNOSIS — Z5321 Procedure and treatment not carried out due to patient leaving prior to being seen by health care provider: Secondary | ICD-10-CM | POA: Insufficient documentation

## 2020-03-04 DIAGNOSIS — R1110 Vomiting, unspecified: Secondary | ICD-10-CM | POA: Insufficient documentation

## 2020-03-04 DIAGNOSIS — R197 Diarrhea, unspecified: Secondary | ICD-10-CM | POA: Insufficient documentation

## 2020-03-04 DIAGNOSIS — R0789 Other chest pain: Secondary | ICD-10-CM | POA: Insufficient documentation

## 2020-03-05 ENCOUNTER — Emergency Department (HOSPITAL_COMMUNITY)
Admission: EM | Admit: 2020-03-05 | Discharge: 2020-03-05 | Disposition: A | Discharge status: Home / Self Care | Payer: Medicare Other | Source: Home / Self Care

## 2020-03-05 ENCOUNTER — Encounter (HOSPITAL_COMMUNITY): Payer: Self-pay | Admitting: Emergency Medicine

## 2020-03-05 ENCOUNTER — Other Ambulatory Visit: Payer: Self-pay

## 2020-03-05 ENCOUNTER — Emergency Department (HOSPITAL_COMMUNITY): Payer: Medicare Other

## 2020-03-05 DIAGNOSIS — R109 Unspecified abdominal pain: Secondary | ICD-10-CM | POA: Insufficient documentation

## 2020-03-05 DIAGNOSIS — Z5321 Procedure and treatment not carried out due to patient leaving prior to being seen by health care provider: Secondary | ICD-10-CM | POA: Insufficient documentation

## 2020-03-05 LAB — URINALYSIS, ROUTINE W REFLEX MICROSCOPIC
Bilirubin Urine: NEGATIVE
Glucose, UA: 150 mg/dL — AB
Hgb urine dipstick: NEGATIVE
Ketones, ur: NEGATIVE mg/dL
Leukocytes,Ua: NEGATIVE
Nitrite: NEGATIVE
Protein, ur: NEGATIVE mg/dL
Specific Gravity, Urine: 1.01 (ref 1.005–1.030)
pH: 7 (ref 5.0–8.0)

## 2020-03-05 LAB — CBC
HCT: 32.3 % — ABNORMAL LOW (ref 36.0–46.0)
Hemoglobin: 10.2 g/dL — ABNORMAL LOW (ref 12.0–15.0)
MCH: 24.7 pg — ABNORMAL LOW (ref 26.0–34.0)
MCHC: 31.6 g/dL (ref 30.0–36.0)
MCV: 78.2 fL — ABNORMAL LOW (ref 80.0–100.0)
Platelets: 250 10*3/uL (ref 150–400)
RBC: 4.13 MIL/uL (ref 3.87–5.11)
RDW: 15.7 % — ABNORMAL HIGH (ref 11.5–15.5)
WBC: 10.4 10*3/uL (ref 4.0–10.5)
nRBC: 0 % (ref 0.0–0.2)

## 2020-03-05 LAB — CBG MONITORING, ED: Glucose-Capillary: 347 mg/dL — ABNORMAL HIGH (ref 70–99)

## 2020-03-05 LAB — I-STAT BETA HCG BLOOD, ED (MC, WL, AP ONLY): I-stat hCG, quantitative: <5 m[IU]/mL (ref <5)

## 2020-03-05 LAB — BASIC METABOLIC PANEL
Anion gap: 13 (ref 5–15)
BUN: 23 mg/dL — ABNORMAL HIGH (ref 6–20)
CO2: 19 mmol/L — ABNORMAL LOW (ref 22–32)
Calcium: 9.6 mg/dL (ref 8.9–10.3)
Chloride: 105 mmol/L (ref 98–111)
Creatinine, Ser: 1.46 mg/dL — ABNORMAL HIGH (ref 0.44–1.00)
GFR calc Af Amer: 47 mL/min — ABNORMAL LOW (ref >60)
GFR calc non Af Amer: 40 mL/min — ABNORMAL LOW (ref >60)
Glucose, Bld: 232 mg/dL — ABNORMAL HIGH (ref 70–99)
Potassium: 4.9 mmol/L (ref 3.5–5.1)
Sodium: 137 mmol/L (ref 135–145)

## 2020-03-05 LAB — TROPONIN I (HIGH SENSITIVITY): Troponin I (High Sensitivity): 9 ng/L (ref <18)

## 2020-03-05 MED ORDER — SODIUM CHLORIDE 0.9% FLUSH: 3.0000 mL | Freq: Once | INTRAVENOUS | Status: DC

## 2020-03-05 MED ORDER — ONDANSETRON HCL 4 MG/2ML IJ SOLN
4.0000 mg | Freq: Once | INTRAMUSCULAR | Status: AC | PRN
  Administered 2020-03-05: 4 mg via INTRAVENOUS
  Filled 2020-03-05: qty 2

## 2020-03-05 MED ORDER — ONDANSETRON 4 MG PO TBDP
4.0000 mg | ORAL_TABLET | Freq: Once | ORAL | Status: AC | PRN
  Administered 2020-03-05: 4 mg via ORAL
  Filled 2020-03-05 (×2): qty 1

## 2020-03-05 MED ORDER — OXYCODONE-ACETAMINOPHEN 5-325 MG PO TABS: 1.0000 | ORAL_TABLET | ORAL | Status: DC | PRN

## 2020-03-05 NOTE — ED Notes (Signed)
Patient vomiting in waiting area more zofran given

## 2020-03-05 NOTE — ED Triage Notes (Signed)
Patient coming from [facility] ED with abdominal pain

## 2020-03-05 NOTE — ED Notes (Signed)
Patient screaming "I'm hurting so bad please someone help me." patient moving around in wheelchair and EMS IV removed by patient. Gauze placed over site.

## 2020-03-05 NOTE — ED Notes (Signed)
Patient states she is going to [facility]

## 2020-03-05 NOTE — ED Triage Notes (Addendum)
Pt presents to ED BIB [facility]. Pt c/o central non radiating CP and emesis x6 and diarrhea. Pt reports that she had chest pain that began this morning and then subsided. Emesis all day. Pt reports CP came back and she called EMS.   EMS: 181/87 HR - 86 NS

## 2020-03-07 ENCOUNTER — Other Ambulatory Visit: Payer: Self-pay

## 2020-03-07 ENCOUNTER — Emergency Department (HOSPITAL_BASED_OUTPATIENT_CLINIC_OR_DEPARTMENT_OTHER): Payer: Medicare Other

## 2020-03-07 ENCOUNTER — Inpatient Hospital Stay (HOSPITAL_BASED_OUTPATIENT_CLINIC_OR_DEPARTMENT_OTHER)
Admission: EM | Admit: 2020-03-07 | Discharge: 2020-03-12 | DRG: 638 | Disposition: A | Discharge status: Home / Self Care | Payer: Medicare Other | Attending: Internal Medicine | Admitting: Internal Medicine

## 2020-03-07 ENCOUNTER — Encounter (HOSPITAL_BASED_OUTPATIENT_CLINIC_OR_DEPARTMENT_OTHER): Payer: Self-pay | Admitting: Emergency Medicine

## 2020-03-07 DIAGNOSIS — Z888 Allergy status to other drugs, medicaments and biological substances status: Secondary | ICD-10-CM

## 2020-03-07 DIAGNOSIS — A059 Bacterial foodborne intoxication, unspecified: Secondary | ICD-10-CM | POA: Diagnosis not present

## 2020-03-07 DIAGNOSIS — K529 Noninfective gastroenteritis and colitis, unspecified: Secondary | ICD-10-CM | POA: Diagnosis not present

## 2020-03-07 DIAGNOSIS — D573 Sickle-cell trait: Secondary | ICD-10-CM | POA: Diagnosis present

## 2020-03-07 DIAGNOSIS — E870 Hyperosmolality and hypernatremia: Secondary | ICD-10-CM | POA: Diagnosis not present

## 2020-03-07 DIAGNOSIS — Z20822 Contact with and (suspected) exposure to covid-19: Secondary | ICD-10-CM | POA: Diagnosis not present

## 2020-03-07 DIAGNOSIS — E1122 Type 2 diabetes mellitus with diabetic chronic kidney disease: Secondary | ICD-10-CM | POA: Diagnosis present

## 2020-03-07 DIAGNOSIS — I129 Hypertensive chronic kidney disease with stage 1 through stage 4 chronic kidney disease, or unspecified chronic kidney disease: Secondary | ICD-10-CM | POA: Diagnosis not present

## 2020-03-07 DIAGNOSIS — N183 Chronic kidney disease, stage 3 unspecified: Secondary | ICD-10-CM | POA: Diagnosis present

## 2020-03-07 DIAGNOSIS — N179 Acute kidney failure, unspecified: Secondary | ICD-10-CM | POA: Diagnosis present

## 2020-03-07 DIAGNOSIS — H409 Unspecified glaucoma: Secondary | ICD-10-CM | POA: Diagnosis not present

## 2020-03-07 DIAGNOSIS — R112 Nausea with vomiting, unspecified: Secondary | ICD-10-CM | POA: Diagnosis present

## 2020-03-07 DIAGNOSIS — Z9049 Acquired absence of other specified parts of digestive tract: Secondary | ICD-10-CM

## 2020-03-07 DIAGNOSIS — E1110 Type 2 diabetes mellitus with ketoacidosis without coma: Principal | ICD-10-CM | POA: Diagnosis present

## 2020-03-07 DIAGNOSIS — Z794 Long term (current) use of insulin: Secondary | ICD-10-CM

## 2020-03-07 DIAGNOSIS — D509 Iron deficiency anemia, unspecified: Secondary | ICD-10-CM | POA: Diagnosis not present

## 2020-03-07 DIAGNOSIS — J45909 Unspecified asthma, uncomplicated: Secondary | ICD-10-CM | POA: Diagnosis not present

## 2020-03-07 DIAGNOSIS — Z90710 Acquired absence of both cervix and uterus: Secondary | ICD-10-CM

## 2020-03-07 DIAGNOSIS — K2970 Gastritis, unspecified, without bleeding: Secondary | ICD-10-CM | POA: Diagnosis present

## 2020-03-07 DIAGNOSIS — R1013 Epigastric pain: Secondary | ICD-10-CM

## 2020-03-07 DIAGNOSIS — R7401 Elevation of levels of liver transaminase levels: Secondary | ICD-10-CM | POA: Diagnosis present

## 2020-03-07 DIAGNOSIS — K3184 Gastroparesis: Secondary | ICD-10-CM | POA: Diagnosis present

## 2020-03-07 DIAGNOSIS — E1143 Type 2 diabetes mellitus with diabetic autonomic (poly)neuropathy: Secondary | ICD-10-CM | POA: Diagnosis not present

## 2020-03-07 DIAGNOSIS — Z79899 Other long term (current) drug therapy: Secondary | ICD-10-CM | POA: Diagnosis not present

## 2020-03-07 DIAGNOSIS — R739 Hyperglycemia, unspecified: Secondary | ICD-10-CM

## 2020-03-07 DIAGNOSIS — N1832 Chronic kidney disease, stage 3b: Secondary | ICD-10-CM | POA: Diagnosis present

## 2020-03-07 DIAGNOSIS — Z87891 Personal history of nicotine dependence: Secondary | ICD-10-CM

## 2020-03-07 DIAGNOSIS — E7800 Pure hypercholesterolemia, unspecified: Secondary | ICD-10-CM | POA: Diagnosis not present

## 2020-03-07 DIAGNOSIS — I10 Essential (primary) hypertension: Secondary | ICD-10-CM

## 2020-03-07 DIAGNOSIS — E1121 Type 2 diabetes mellitus with diabetic nephropathy: Secondary | ICD-10-CM | POA: Diagnosis not present

## 2020-03-07 DIAGNOSIS — E119 Type 2 diabetes mellitus without complications: Secondary | ICD-10-CM

## 2020-03-07 LAB — COMPREHENSIVE METABOLIC PANEL
ALT: 234 U/L — ABNORMAL HIGH (ref 0–44)
AST: 89 U/L — ABNORMAL HIGH (ref 15–41)
Albumin: 4 g/dL (ref 3.5–5.0)
Alkaline Phosphatase: 324 U/L — ABNORMAL HIGH (ref 38–126)
Anion gap: 18 — ABNORMAL HIGH (ref 5–15)
BUN: 50 mg/dL — ABNORMAL HIGH (ref 6–20)
CO2: 20 mmol/L — ABNORMAL LOW (ref 22–32)
Calcium: 9.7 mg/dL (ref 8.9–10.3)
Chloride: 100 mmol/L (ref 98–111)
Creatinine, Ser: 2.56 mg/dL — ABNORMAL HIGH (ref 0.44–1.00)
GFR calc Af Amer: 24 mL/min — ABNORMAL LOW (ref >60)
GFR calc non Af Amer: 20 mL/min — ABNORMAL LOW (ref >60)
Glucose, Bld: 462 mg/dL — ABNORMAL HIGH (ref 70–99)
Potassium: 4.2 mmol/L (ref 3.5–5.1)
Sodium: 138 mmol/L (ref 135–145)
Total Bilirubin: 0.9 mg/dL (ref 0.3–1.2)
Total Protein: 8.7 g/dL — ABNORMAL HIGH (ref 6.5–8.1)

## 2020-03-07 LAB — CBC WITH DIFFERENTIAL/PLATELET
Abs Immature Granulocytes: 0.11 10*3/uL — ABNORMAL HIGH (ref 0.00–0.07)
Basophils Absolute: 0 10*3/uL (ref 0.0–0.1)
Basophils Relative: 0 %
Eosinophils Absolute: 0 10*3/uL (ref 0.0–0.5)
Eosinophils Relative: 0 %
HCT: 36.7 % (ref 36.0–46.0)
Hemoglobin: 11.7 g/dL — ABNORMAL LOW (ref 12.0–15.0)
Immature Granulocytes: 1 %
Lymphocytes Relative: 12 %
Lymphs Abs: 1.9 10*3/uL (ref 0.7–4.0)
MCH: 24.8 pg — ABNORMAL LOW (ref 26.0–34.0)
MCHC: 31.9 g/dL (ref 30.0–36.0)
MCV: 77.8 fL — ABNORMAL LOW (ref 80.0–100.0)
Monocytes Absolute: 0.6 10*3/uL (ref 0.1–1.0)
Monocytes Relative: 4 %
Neutro Abs: 12.5 10*3/uL — ABNORMAL HIGH (ref 1.7–7.7)
Neutrophils Relative %: 83 %
Platelets: 313 10*3/uL (ref 150–400)
RBC: 4.72 MIL/uL (ref 3.87–5.11)
RDW: 15.8 % — ABNORMAL HIGH (ref 11.5–15.5)
WBC: 15.1 10*3/uL — ABNORMAL HIGH (ref 4.0–10.5)
nRBC: 0 % (ref 0.0–0.2)

## 2020-03-07 LAB — BASIC METABOLIC PANEL
Anion gap: 12 (ref 5–15)
BUN: 45 mg/dL — ABNORMAL HIGH (ref 6–20)
CO2: 22 mmol/L (ref 22–32)
Calcium: 8.6 mg/dL — ABNORMAL LOW (ref 8.9–10.3)
Chloride: 107 mmol/L (ref 98–111)
Creatinine, Ser: 2.36 mg/dL — ABNORMAL HIGH (ref 0.44–1.00)
GFR calc Af Amer: 26 mL/min — ABNORMAL LOW (ref >60)
GFR calc non Af Amer: 23 mL/min — ABNORMAL LOW (ref >60)
Glucose, Bld: 390 mg/dL — ABNORMAL HIGH (ref 70–99)
Potassium: 4 mmol/L (ref 3.5–5.1)
Sodium: 141 mmol/L (ref 135–145)

## 2020-03-07 LAB — URINALYSIS, ROUTINE W REFLEX MICROSCOPIC
Bilirubin Urine: NEGATIVE
Glucose, UA: >=500 mg/dL — AB
Ketones, ur: 15 mg/dL — AB
Leukocytes,Ua: NEGATIVE
Nitrite: NEGATIVE
Protein, ur: 30 mg/dL — AB
Specific Gravity, Urine: 1.02 (ref 1.005–1.030)
pH: 5.5 (ref 5.0–8.0)

## 2020-03-07 LAB — URINALYSIS, MICROSCOPIC (REFLEX)

## 2020-03-07 LAB — I-STAT VENOUS BLOOD GAS, ED
Acid-base deficit: 1 mmol/L (ref 0.0–2.0)
Bicarbonate: 22.3 mmol/L (ref 20.0–28.0)
Calcium, Ion: 1.17 mmol/L (ref 1.15–1.40)
HCT: 35 % — ABNORMAL LOW (ref 36.0–46.0)
Hemoglobin: 11.9 g/dL — ABNORMAL LOW (ref 12.0–15.0)
O2 Saturation: 91 %
Patient temperature: 97.7
Potassium: 4.1 mmol/L (ref 3.5–5.1)
Sodium: 140 mmol/L (ref 135–145)
TCO2: 23 mmol/L (ref 22–32)
pCO2, Ven: 32.5 mmHg — ABNORMAL LOW (ref 44.0–60.0)
pH, Ven: 7.442 — ABNORMAL HIGH (ref 7.250–7.430)
pO2, Ven: 56 mmHg — ABNORMAL HIGH (ref 32.0–45.0)

## 2020-03-07 LAB — HEMOGLOBIN A1C
Hgb A1c MFr Bld: 8.8 % — ABNORMAL HIGH (ref 4.8–5.6)
Mean Plasma Glucose: 205.86 mg/dL

## 2020-03-07 LAB — CBG MONITORING, ED
Glucose-Capillary: 478 mg/dL — ABNORMAL HIGH (ref 70–99)
Glucose-Capillary: 389 mg/dL — ABNORMAL HIGH (ref 70–99)
Glucose-Capillary: 372 mg/dL — ABNORMAL HIGH (ref 70–99)

## 2020-03-07 LAB — GLUCOSE, CAPILLARY
Glucose-Capillary: 365 mg/dL — ABNORMAL HIGH (ref 70–99)
Glucose-Capillary: 112 mg/dL — ABNORMAL HIGH (ref 70–99)

## 2020-03-07 LAB — LIPASE, BLOOD: Lipase: 31 U/L (ref 11–51)

## 2020-03-07 LAB — BETA-HYDROXYBUTYRIC ACID: Beta-Hydroxybutyric Acid: 1.66 mmol/L — ABNORMAL HIGH (ref 0.05–0.27)

## 2020-03-07 LAB — SARS CORONAVIRUS 2 BY RT PCR (HOSPITAL ORDER, PERFORMED IN ~~LOC~~ HOSPITAL LAB): SARS Coronavirus 2: NEGATIVE

## 2020-03-07 MED ORDER — METOCLOPRAMIDE HCL 5 MG/ML IJ SOLN
10.0000 mg | Freq: Four times a day (QID) | INTRAMUSCULAR | Status: DC
  Administered 2020-03-07 – 2020-03-09 (×8): 10 mg via INTRAVENOUS
  Filled 2020-03-07 (×10): qty 2

## 2020-03-07 MED ORDER — PROMETHAZINE HCL 25 MG/ML IJ SOLN
25.0000 mg | Freq: Once | INTRAMUSCULAR | Status: AC
  Administered 2020-03-07: 25 mg via INTRAVENOUS

## 2020-03-07 MED ORDER — INSULIN ASPART 100 UNIT/ML ~~LOC~~ SOLN
0.0000 [IU] | Freq: Three times a day (TID) | SUBCUTANEOUS | Status: DC
  Administered 2020-03-07: 15 [IU] via SUBCUTANEOUS
  Administered 2020-03-08: 2 [IU] via SUBCUTANEOUS
  Administered 2020-03-08 – 2020-03-10 (×5): 3 [IU] via SUBCUTANEOUS
  Administered 2020-03-10: 8 [IU] via SUBCUTANEOUS
  Administered 2020-03-10: 11 [IU] via SUBCUTANEOUS
  Administered 2020-03-11: 3 [IU] via SUBCUTANEOUS
  Administered 2020-03-11: 11 [IU] via SUBCUTANEOUS
  Administered 2020-03-11: 5 [IU] via SUBCUTANEOUS
  Administered 2020-03-12: 8 [IU] via SUBCUTANEOUS
  Administered 2020-03-12: 5 [IU] via SUBCUTANEOUS

## 2020-03-07 MED ORDER — BOOST / RESOURCE BREEZE PO LIQD CUSTOM
1.0000 | Freq: Three times a day (TID) | ORAL | Status: DC
  Administered 2020-03-07 – 2020-03-12 (×4): 1 via ORAL

## 2020-03-07 MED ORDER — INSULIN GLARGINE 100 UNIT/ML ~~LOC~~ SOLN
25.0000 [IU] | Freq: Every day | SUBCUTANEOUS | Status: DC
  Administered 2020-03-07 – 2020-03-09 (×3): 25 [IU] via SUBCUTANEOUS
  Filled 2020-03-07 (×3): qty 0.25

## 2020-03-07 MED ORDER — SODIUM CHLORIDE 0.9 % IV SOLN: INTRAVENOUS | Status: AC

## 2020-03-07 MED ORDER — OXYCODONE HCL 5 MG PO TABS
5.0000 mg | ORAL_TABLET | ORAL | Status: DC | PRN
  Administered 2020-03-10 – 2020-03-11 (×2): 5 mg via ORAL
  Filled 2020-03-07 (×2): qty 1

## 2020-03-07 MED ORDER — MORPHINE SULFATE (PF) 2 MG/ML IV SOLN
2.0000 mg | INTRAVENOUS | Status: DC | PRN
  Administered 2020-03-07 – 2020-03-09 (×4): 2 mg via INTRAVENOUS
  Filled 2020-03-07 (×4): qty 1

## 2020-03-07 MED ORDER — SODIUM CHLORIDE 0.9 % IV BOLUS
1000.0000 mL | Freq: Once | INTRAVENOUS | Status: AC
  Administered 2020-03-07: 1000 mL via INTRAVENOUS

## 2020-03-07 MED ORDER — METOCLOPRAMIDE HCL 5 MG/ML IJ SOLN
5.0000 mg | Freq: Four times a day (QID) | INTRAMUSCULAR | Status: DC
  Administered 2020-03-07: 5 mg via INTRAVENOUS
  Filled 2020-03-07: qty 2

## 2020-03-07 MED ORDER — INSULIN ASPART 100 UNIT/ML ~~LOC~~ SOLN
0.0000 [IU] | Freq: Every day | SUBCUTANEOUS | Status: DC
  Administered 2020-03-07: 0 [IU] via SUBCUTANEOUS

## 2020-03-07 MED ORDER — ONDANSETRON HCL 4 MG PO TABS: 4.0000 mg | ORAL_TABLET | Freq: Four times a day (QID) | ORAL | Status: DC | PRN

## 2020-03-07 MED ORDER — ONDANSETRON HCL 4 MG/2ML IJ SOLN
4.0000 mg | Freq: Four times a day (QID) | INTRAMUSCULAR | Status: DC | PRN
  Administered 2020-03-07 – 2020-03-10 (×4): 4 mg via INTRAVENOUS
  Filled 2020-03-07 (×5): qty 2

## 2020-03-07 MED ORDER — FENTANYL CITRATE (PF) 100 MCG/2ML IJ SOLN
100.0000 ug | Freq: Once | INTRAMUSCULAR | Status: AC
  Administered 2020-03-07: 100 ug via INTRAVENOUS
  Filled 2020-03-07: qty 2

## 2020-03-07 MED ORDER — ONDANSETRON HCL 4 MG/2ML IJ SOLN
4.0000 mg | Freq: Once | INTRAMUSCULAR | Status: AC
  Administered 2020-03-07: 4 mg via INTRAVENOUS
  Filled 2020-03-07: qty 2

## 2020-03-07 MED ORDER — PROMETHAZINE HCL 25 MG/ML IJ SOLN
INTRAMUSCULAR | Status: AC
  Filled 2020-03-07: qty 1

## 2020-03-07 MED ORDER — METOPROLOL TARTRATE 5 MG/5ML IV SOLN
5.0000 mg | Freq: Four times a day (QID) | INTRAVENOUS | Status: DC | PRN
  Administered 2020-03-07: 5 mg via INTRAVENOUS
  Filled 2020-03-07: qty 5

## 2020-03-07 MED ORDER — ALBUTEROL SULFATE (2.5 MG/3ML) 0.083% IN NEBU: 2.5000 mg | INHALATION_SOLUTION | RESPIRATORY_TRACT | Status: DC | PRN

## 2020-03-07 MED ORDER — HEPARIN SODIUM (PORCINE) 5000 UNIT/ML IJ SOLN
5000.0000 [IU] | Freq: Three times a day (TID) | INTRAMUSCULAR | Status: DC
  Administered 2020-03-07 – 2020-03-11 (×14): 5000 [IU] via SUBCUTANEOUS
  Filled 2020-03-07 (×14): qty 1

## 2020-03-07 MED ORDER — PROMETHAZINE HCL 25 MG/ML IJ SOLN
12.5000 mg | Freq: Four times a day (QID) | INTRAMUSCULAR | Status: DC | PRN
  Administered 2020-03-07 – 2020-03-11 (×8): 12.5 mg via INTRAVENOUS
  Filled 2020-03-07 (×8): qty 1

## 2020-03-07 NOTE — ED Triage Notes (Signed)
PT presents with emesis, abdominal pain and high CBG readings since Thursday. CBG 478 in triage

## 2020-03-07 NOTE — H&P (Addendum)
[facility] History and Physical  Kirsten Shaffer FFM:[id number] DOB: November 22, 1965 DOA: 03/07/2020   PCP: System, Pcp Not In  Specialists: Followed by endocrinology in [facility]  Chief Complaint: Nausea vomiting and abdominal pain since Thursday  HPI: Kirsten Shaffer is a 54 y.o. female with a past medical history of diabetes on insulin, essential hypertension, CKD3b who ate out on Wednesday with her son.  Both of them got sick.  He got better however her symptoms persisted.  She has had multiple episodes of vomiting, initially emesis of food followed by clear liquid.  Denies any blood in the emesis.  She also had loose stools but not watery.  Denies any blood in the stool.  She also mentioned diffuse abdominal pain but more in the upper abdomen.  No fever or chills.  She last took her insulin on Wednesday or Thursday.  She was scared of her glucose levels dropping too much and so she did not take her insulin the last few days. She decided to come into the emergency department due to persistent symptoms.  She is feeling slightly better now since her nausea appears to have subsided.  In the emergency department she was found to have mild diabetic ketoacidosis with an anion gap of 18 and a glucose level of 462.  She was also found to have transaminitis.  She is status post cholecystectomy previously.  She was also found to have acute kidney injury.  She was hospitalized for further management.  Home Medications: Prior to Admission medications   Medication Sig Start Date End Date Taking? Authorizing Provider  albuterol (PROVENTIL HFA;VENTOLIN HFA) 108 (90 BASE) MCG/ACT inhaler Inhale 1-2 puffs into the lungs every 6 (six) hours as needed for wheezing or shortness of breath. 09/04/14  Yes Pamella Pert, MD  hydrochlorothiazide (HYDRODIURIL) 12.5 MG tablet Take 12.5 mg by mouth daily. 09/07/17  Yes [provider]  insulin NPH Human (HUMULIN N,NOVOLIN N) 100 UNIT/ML injection Inject 2-10  Units into the skin 2 (two) times daily as needed (high blood sugar). 351-400    10 units      151-200    2 units    Yes [provider]  sertraline (ZOLOFT) 50 MG tablet Take 1 tablet (50 mg total) by mouth daily. Patient taking differently: Take 50 mg by mouth every evening.  09/24/18 03/07/20 Yes Cobos, Myer Peer, MD  simvastatin (ZOCOR) 40 MG tablet Take 40 mg by mouth at bedtime. 09/13/17  Yes [provider]  TOUJEO MAX SOLOSTAR 300 UNIT/ML Solostar Pen Inject 60 Units into the skin at bedtime. 02/01/20  Yes [provider]  albuterol (PROVENTIL HFA;VENTOLIN HFA) 108 (90 BASE) MCG/ACT inhaler Inhale 2 puffs into the lungs every 4 (four) hours as needed for wheezing. 07/07/11 09/04/14  Blair Heys, MD  blood glucose meter kit and supplies KIT Dispense based on patient and insurance preference. Use up to four times daily as directed. (FOR ICD-9 250.00, 250.01). 10/09/17   Couture, Cortni S, PA-C  Dulaglutide (TRULICITY [place]) Inject into the skin.    [provider]  glimepiride (AMARYL) 4 MG tablet Take 4 mg by mouth daily before breakfast.      [provider]  HYDROcodone-acetaminophen (NORCO/VICODIN) 5-325 MG tablet Take 1-2 tablets by mouth every 4 (four) hours as needed. Patient not taking: Reported on 10/08/2017 04/06/17   Recardo Evangelist, PA-C    Allergies:  Allergies  Allergen Reactions  . Metformin And Related Nausea Only    Past Medical  History: Past Medical History:  Diagnosis Date  . Asthma   . Diabetes mellitus   . Glaucoma   . Hypercholesterolemia   . Hypertension   . Legally blind   . Sickle cell trait [facility])     Past Surgical History:  Procedure Laterality Date  . ABDOMINAL HYSTERECTOMY    . CATARACT EXTRACTION, BILATERAL    . CESAREAN SECTION    . CHOLECYSTECTOMY    . RETINAL DETACHMENT SURGERY     bilaterally  . stent eye      Social History: She lives with her son.  No history of smoking alcohol use illicit drug  use.  Usually independent with daily activities.  Family History:  Family History  Problem Relation Age of Onset  . Diabetes Mother   . Heart failure Mother      Review of Systems - History obtained from the patient General ROS: positive for  - fatigue Psychological ROS: negative Ophthalmic ROS: negative ENT ROS: negative Allergy and Immunology ROS: negative Hematological and Lymphatic ROS: negative Endocrine ROS: negative Respiratory ROS: no cough, shortness of breath, or wheezing Cardiovascular ROS: no chest pain or dyspnea on exertion Gastrointestinal ROS: As in HPI Genito-Urinary ROS: no dysuria, trouble voiding, or hematuria Musculoskeletal ROS: negative Neurological ROS: no TIA or stroke symptoms Dermatological ROS: negative  Physical Examination  Vitals:   03/07/20 0803 03/07/20 1227 03/07/20 1306 03/07/20 1312  BP: (!) 185/90   (!) 176/82  Pulse: (!) 102 (!) 102  (!) 102  Resp: 13 15  16   Temp: 97.7 F (36.5 C)   98.6 F (37 C)  TempSrc: Oral   Oral  SpO2: 99% 99%  98%  Weight:   117.7 kg   Height:   5' 7"  (1.702 m)     BP (!) 176/82 (BP Location: Right Arm)   Pulse (!) 102   Temp 98.6 F (37 C) (Oral)   Resp 16   Ht 5' 7"  (1.702 m)   Wt 117.7 kg   LMP 09/05/2014   SpO2 98%   BMI 40.64 kg/m   General appearance: alert, cooperative, appears stated age and no distress Head: Normocephalic, without obvious abnormality, atraumatic Eyes: conjunctivae/corneas clear. PERRL, EOM's intact.  Throat: Dry mucous membranes Neck: no adenopathy, no carotid bruit, no JVD, supple, symmetrical, trachea midline and thyroid not enlarged, symmetric, no tenderness/mass/nodules Resp: clear to auscultation bilaterally Cardio: regular rate and rhythm, S1, S2 normal, no murmur, click, rub or gallop GI: Abdomen soft.  Mildly tender in the epigastric area without any rebound rigidity or guarding.  No masses organomegaly.  Bowel sounds present. Extremities: extremities normal,  atraumatic, no cyanosis or edema Pulses: 2+ and symmetric Skin: Skin color, texture, turgor normal. No rashes or lesions Lymph nodes: Cervical, supraclavicular, and axillary nodes normal. Neurologic: Alert and oriented x3.  No obvious focal neurological deficits.    Labs on Admission: I have personally reviewed following labs and imaging studies  CBC: Recent Labs  Lab 03/05/20 0018 03/07/20 0807 03/07/20 0839  WBC 10.4 15.1*  --   NEUTROABS  --  12.5*  --   HGB 10.2* 11.7* 11.9*  HCT 32.3* 36.7 35.0*  MCV 78.2* 77.8*  --   PLT 250 313  --    Basic Metabolic Panel: Recent Labs  Lab 03/05/20 0018 03/07/20 0807 03/07/20 0839 03/07/20 1230  NA 137 138 140 141  K 4.9 4.2 4.1 4.0  CL 105 100  --  107  CO2 19* 20*  --  22  GLUCOSE 232* 462*  --  390*  BUN 23* 50*  --  45*  CREATININE 1.46* 2.56*  --  2.36*  CALCIUM 9.6 9.7  --  8.6*   GFR: Estimated Creatinine Clearance: 36.1 mL/min (A) (by C-G formula based on SCr of 2.36 mg/dL (H)). Liver Function Tests: Recent Labs  Lab 03/07/20 0807  AST 89*  ALT 234*  ALKPHOS 324*  BILITOT 0.9  PROT 8.7*  ALBUMIN 4.0   Recent Labs  Lab 03/07/20 0807  LIPASE 31   CBG: Recent Labs  Lab 03/05/20 0845 03/07/20 0750 03/07/20 1117 03/07/20 1214  GLUCAP 347* 478* 389* 372*    Radiological Exams on Admission: CT Abdomen Pelvis Wo Contrast  Result Date: 03/07/2020 CLINICAL DATA:  Bowel obstruction suspected EXAM: CT ABDOMEN AND PELVIS WITHOUT CONTRAST TECHNIQUE: Multidetector CT imaging of the abdomen and pelvis was performed following the standard protocol without IV contrast. COMPARISON:  11/10/2013 FINDINGS: Lower chest: No acute abnormality. Hepatobiliary: No focal liver abnormality is seen. Status post cholecystectomy. No biliary dilatation. Pancreas: Unremarkable. No pancreatic ductal dilatation or surrounding inflammatory changes. Spleen: Normal in size without significant abnormality. Adrenals/Urinary Tract: Adrenal  glands are unremarkable. Kidneys are normal, without renal calculi, solid lesion, or hydronephrosis. Bladder is unremarkable. Stomach/Bowel: Stomach is within normal limits. Appendix appears normal. No evidence of bowel wall thickening, distention, or inflammatory changes. Vascular/Lymphatic: Scattered aortic atherosclerosis. No enlarged abdominal or pelvic lymph nodes. Reproductive: Status post hysterectomy. Other: No abdominal wall hernia or abnormality. No abdominopelvic ascites. Musculoskeletal: No acute or significant osseous findings. IMPRESSION: 1. No non-contrast CT findings of the abdomen or pelvis to explain pain. No evidence of bowel obstruction. 2. Status post cholecystectomy and hysterectomy. 3. Aortic Atherosclerosis (ICD10-I70.0). Electronically Signed   By: Eddie Candle M.D.   On: 03/07/2020 12:27    My interpretation of Electrocardiogram: Sinus tachycardia at 103 bpm.  Normal axis.  Intervals are normal.  No concerning ST or T wave changes noted.   Problem List  Principal Problem:   Intractable nausea and vomiting Active Problems:   DKA (diabetic ketoacidoses) (HCC)   CKD (chronic kidney disease), stage III   DM (diabetes mellitus), type 2 (HCC)   Essential hypertension   AKI (acute kidney injury) ([place])   Transaminitis   Assessment: This is a 54 year old female with a past medical history as stated earlier who comes in with 3 to 4-day history of nausea vomiting abdominal pain.  She was noted to be hyperglycemic and noted to have mild anion gap acidosis.  She likely was in mild DKA.  She has not taken her insulin in the last 3 to 4 days.  Patient was hydrated in the emergency department.  Labs were repeated and her anion gap has closed.  She was also found to have acute kidney injury.  She likely has acute gastroenteritis which could be viral in etiology.  She is noted to have mild transaminitis as well.  Plan:  1. Mild diabetic ketoacidosis in the setting of insulin-dependent  diabetes mellitus: She is followed by endocrinology in [facility].  Based on care everywhere her HbA1c was 9.9 in April.  Since her anion gap is closed and she is feeling better there is no need to initiate IV insulin.  We will place her back on long-acting insulin.  She uses Toujeo at home.  We will place her on Lantus here.  She is very worried about hypoglycemic episodes and she was reassured.  Her CBGs will be monitored.  SSI.  At home patient is on Trulicity, Toujeo, Humalog.  2.  Acute gastroenteritis: Likely triggered by whatever she ate on Wednesday.  And perhaps she has an element of diabetic gastroparesis as well.  She could have also been vomiting because of her DKA.  Symptoms appear to have improved.  We will add scheduled IV Reglan for now till her symptoms completely subside.  Might benefit from evaluation for diabetic gastroparesis in the outpatient setting.  CT scan was nonacute.  UA noted to be abnormal but likely a contaminated sample since there was squamous epithelial cells.  Lipase was normal.  3.  Acute kidney injury on chronic kidney disease stage IIIb: Her baseline creatinine is around 1.5.  Came in with a BUN of 50 and creatinine of 2.56.  Most likely due to volume loss.  She will be hydrated.  Monitor urine output.  Recheck labs tomorrow.  4.  Transaminitis: Possibly viral in etiology.  CT scan of the abdomen pelvis did not show any hepatobiliary abnormalities.  She is status post cholecystectomy.  Check hepatitis panel.  Recheck labs tomorrow.  Hold statin.  5. Microcytic anemia: No evidence for overt bleeding.  Hemoglobin seems to be close to her baseline.  Outpatient management.  6.  Essential hypertension: Monitor blood pressures closely.  Metoprolol as needed.  Holding her hydrochlorothiazide.  According to care everywhere she is also supposed to be on losartan which will also be held.  Holding her statin due to transaminitis.   DVT Prophylaxis: Subcutaneous heparin Code  Status: Full code Family Communication: Discussed with the patient Disposition: Hopefully return home in improved Consults called: None Admission Status: Status is: Observation  The patient remains OBS appropriate and will d/c before 2 midnights.  Dispo: The patient is from: Home              Anticipated d/c is to: Home              Anticipated d/c date is: 1 day              Patient currently is not medically stable to d/c.    Severity of Illness: The appropriate patient status for this patient is OBSERVATION. Observation status is judged to be reasonable and necessary in order to provide the required intensity of service to ensure the patient's safety. The patient's presenting symptoms, physical exam findings, and initial radiographic and laboratory data in the context of their medical condition is felt to place them at decreased risk for further clinical deterioration. Furthermore, it is anticipated that the patient will be medically stable for discharge from the hospital within 2 midnights of admission. The following factors support the patient status of observation.   " The patient's presenting symptoms include nausea vomiting. " The physical exam findings include abdominal tenderness. " The initial radiographic and laboratory data are mild DKA, acute kidney injury.   Further management decisions will depend on results of further testing and patient's response to treatment.   Aamari Strawderman [employer]  [facility] operational officer on [email or web address]  03/07/2020, 3:51 PM

## 2020-03-07 NOTE — ED Provider Notes (Signed)
[facility] EMERGENCY DEPARTMENT Provider Note   CSN: [id number] Arrival date & time: 03/07/20  0746     History Chief Complaint  Patient presents with  . Diabetic Ketoacidosis    Kirsten Shaffer is a 54 y.o. female.  She has a history of diabetes and hypertension.  She is here with complaint of nausea vomiting diarrhea abdominal pain and elevated blood sugars that started 4 days ago.  She was at [facility] yesterday and had labs done but left prior to being seen.  Taking Phenergan and Zofran without improvement.  No blood from above or below.  No fever.  No cough.  Does feels shortness of breath.  No chest pain.  Donnell pain is generalized.  No prior history of DKA.  No urinary symptoms.  Had her Covid vaccine.  The history is provided by the patient.  Abdominal Pain Pain location:  Generalized Pain quality: cramping   Pain severity:  Moderate Onset quality:  Gradual Timing:  Constant Progression:  Worsening Chronicity:  New Context: not recent travel and not trauma   Relieved by:  Nothing Worsened by:  Nothing Ineffective treatments:  None tried Associated symptoms: diarrhea, nausea, shortness of breath and vomiting   Associated symptoms: no chest pain, no chills, no constipation, no cough, no dysuria, no fever, no hematemesis, no hematochezia, no hematuria and no sore throat        Past Medical History:  Diagnosis Date  . Asthma   . Diabetes mellitus   . Glaucoma   . Hypercholesterolemia   . Hypertension   . Legally blind   . Sickle cell trait [facility])     Patient Active Problem List   Diagnosis Date Noted  . Lipoma of back 05/24/2012    Past Surgical History:  Procedure Laterality Date  . ABDOMINAL HYSTERECTOMY    . CATARACT EXTRACTION, BILATERAL    . CESAREAN SECTION    . CHOLECYSTECTOMY    . RETINAL DETACHMENT SURGERY     bilaterally  . stent eye       OB History   No obstetric history on file.     Family History  Problem Relation Age of  Onset  . Diabetes Mother   . Heart failure Mother     Social History   Tobacco Use  . Smoking status: Former Research scientist (life sciences)  . Smokeless tobacco: Never Used  Vaping Use  . Vaping Use: Never used  Substance Use Topics  . Alcohol use: Yes    Comment: ocassionally  . Drug use: No    Home Medications Prior to Admission medications   Medication Sig Start Date End Date Taking? Authorizing Provider  albuterol (PROVENTIL HFA;VENTOLIN HFA) 108 (90 BASE) MCG/ACT inhaler Inhale 2 puffs into the lungs every 4 (four) hours as needed for wheezing. 07/07/11 09/04/14  Blair Heys, MD  albuterol (PROVENTIL HFA;VENTOLIN HFA) 108 (90 BASE) MCG/ACT inhaler Inhale 1-2 puffs into the lungs every 6 (six) hours as needed for wheezing or shortness of breath. 09/04/14   Pamella Pert, MD  blood glucose meter kit and supplies KIT Dispense based on patient and insurance preference. Use up to four times daily as directed. (FOR ICD-9 250.00, 250.01). 10/09/17   Couture, Cortni S, PA-C  Dulaglutide (TRULICITY [place]) Inject into the skin.    [provider]  glimepiride (AMARYL) 4 MG tablet Take 4 mg by mouth daily before breakfast.      [provider]  hydrochlorothiazide (HYDRODIURIL) 12.5 MG tablet Take 12.5 mg by mouth  daily. 09/07/17   [provider]  HYDROcodone-acetaminophen (NORCO/VICODIN) 5-325 MG tablet Take 1-2 tablets by mouth every 4 (four) hours as needed. Patient not taking: Reported on 10/08/2017 04/06/17   Recardo Evangelist, PA-C  insulin NPH Human (HUMULIN N,NOVOLIN N) 100 UNIT/ML injection Inject 2-10 Units into the skin 2 (two) times daily as needed (high blood sugar). 351-400    10 units      151-200    2 units     [provider]  LANTUS SOLOSTAR 100 UNIT/ML Solostar Pen INJECT 50 UNITS EVERY MORNING AND 20 UNITS EVERY EVENING AT BEDTIME 10/02/17   [provider]  Semaglutide (OZEMPIC) 0.25 or 0.5 MG/DOSE SOPN Inject 0.5 mg into the skin once a week. Every  Wednesday.    [provider]  sertraline (ZOLOFT) 50 MG tablet Take 1 tablet (50 mg total) by mouth daily. 09/24/18 09/24/19  Cobos, Myer Peer, MD  simvastatin (ZOCOR) 40 MG tablet Take 40 mg by mouth at bedtime. 09/13/17   [provider]    Allergies    Metformin and related  Review of Systems   Review of Systems  Constitutional: Negative for chills and fever.  HENT: Negative for sore throat.   Eyes: Negative for pain.  Respiratory: Positive for shortness of breath. Negative for cough.   Cardiovascular: Negative for chest pain.  Gastrointestinal: Positive for abdominal pain, diarrhea, nausea and vomiting. Negative for constipation, hematemesis and hematochezia.  Genitourinary: Negative for dysuria and hematuria.  Musculoskeletal: Negative for neck pain.  Skin: Negative for rash.  Neurological: Negative for headaches.    Physical Exam Updated Vital Signs BP (!) 189/94 (BP Location: Left Arm)   Pulse (!) 103   Temp 98.7 F (37.1 C)   Resp 17   Ht 5' 7"  (1.702 m)   Wt 117.7 kg   LMP 09/05/2014   SpO2 98%   BMI 40.64 kg/m   Physical Exam Vitals and nursing note reviewed.  Constitutional:      General: She is in acute distress.     Appearance: Normal appearance. She is well-developed.  HENT:     Head: Normocephalic and atraumatic.  Eyes:     Conjunctiva/sclera: Conjunctivae normal.  Cardiovascular:     Rate and Rhythm: Regular rhythm. Tachycardia present.     Heart sounds: No murmur heard.   Pulmonary:     Effort: Pulmonary effort is normal. No respiratory distress.     Breath sounds: Normal breath sounds.  Abdominal:     Palpations: Abdomen is soft.     Tenderness: There is no abdominal tenderness. There is no guarding or rebound.  Musculoskeletal:        General: No deformity or signs of injury. Normal range of motion.     Cervical back: Neck supple.  Skin:    General: Skin is warm and dry.     Capillary Refill: Capillary refill takes less  than 2 seconds.  Neurological:     General: No focal deficit present.     Mental Status: She is alert.     ED Results / Procedures / Treatments   Labs (all labs ordered are listed, but only abnormal results are displayed) Labs Reviewed  COMPREHENSIVE METABOLIC PANEL - Abnormal; Notable for the following components:      Result Value   CO2 20 (*)    Glucose, Bld 462 (*)    BUN 50 (*)    Creatinine, Ser 2.56 (*)    Total Protein 8.7 (*)  AST 89 (*)    ALT 234 (*)    Alkaline Phosphatase 324 (*)    GFR calc non Af Amer 20 (*)    GFR calc Af Amer 24 (*)    Anion gap 18 (*)    All other components within normal limits  CBC WITH DIFFERENTIAL/PLATELET - Abnormal; Notable for the following components:   WBC 15.1 (*)    Hemoglobin 11.7 (*)    MCV 77.8 (*)    MCH 24.8 (*)    RDW 15.8 (*)    Neutro Abs 12.5 (*)    Abs Immature Granulocytes 0.11 (*)    All other components within normal limits  BETA-HYDROXYBUTYRIC ACID - Abnormal; Notable for the following components:   Beta-Hydroxybutyric Acid 1.66 (*)    All other components within normal limits  HEMOGLOBIN A1C - Abnormal; Notable for the following components:   Hgb A1c MFr Bld 8.8 (*)    All other components within normal limits  URINALYSIS, ROUTINE W REFLEX MICROSCOPIC - Abnormal; Notable for the following components:   Glucose, UA >=500 (*)    Hgb urine dipstick SMALL (*)    Ketones, ur 15 (*)    Protein, ur 30 (*)    All other components within normal limits  URINALYSIS, MICROSCOPIC (REFLEX) - Abnormal; Notable for the following components:   Bacteria, UA MANY (*)    All other components within normal limits  BASIC METABOLIC PANEL - Abnormal; Notable for the following components:   Glucose, Bld 390 (*)    BUN 45 (*)    Creatinine, Ser 2.36 (*)    Calcium 8.6 (*)    GFR calc non Af Amer 23 (*)    GFR calc Af Amer 26 (*)    All other components within normal limits  GLUCOSE, CAPILLARY - Abnormal; Notable for the  following components:   Glucose-Capillary 365 (*)    All other components within normal limits  CBG MONITORING, ED - Abnormal; Notable for the following components:   Glucose-Capillary 478 (*)    All other components within normal limits  I-STAT VENOUS BLOOD GAS, ED - Abnormal; Notable for the following components:   pH, Ven 7.442 (*)    pCO2, Ven 32.5 (*)    pO2, Ven 56.0 (*)    HCT 35.0 (*)    Hemoglobin 11.9 (*)    All other components within normal limits  CBG MONITORING, ED - Abnormal; Notable for the following components:   Glucose-Capillary 389 (*)    All other components within normal limits  CBG MONITORING, ED - Abnormal; Notable for the following components:   Glucose-Capillary 372 (*)    All other components within normal limits  SARS CORONAVIRUS 2 BY RT PCR (HOSPITAL ORDER, [facility])  LIPASE, BLOOD  BLOOD GAS, VENOUS  HEPATITIS PANEL, ACUTE  HIV ANTIBODY (ROUTINE TESTING W REFLEX)  HEMOGLOBIN A1C  COMPREHENSIVE METABOLIC PANEL  CBC  HEPATITIS PANEL, ACUTE    EKG EKG Interpretation  Date/Time:  Sunday March 07 2020 08:02:27 EDT Ventricular Rate:  103 PR Interval:    QRS Duration: 97 QT Interval:  345 QTC Calculation: 452 R Axis:   66 Text Interpretation: Sinus tachycardia Low voltage, precordial leads No significant change since prior 2 days ago Confirmed by Aletta Edouard [phone]) on 03/07/2020 8:04:37 AM   Radiology CT Abdomen Pelvis Wo Contrast  Result Date: 03/07/2020 CLINICAL DATA:  Bowel obstruction suspected EXAM: CT ABDOMEN AND PELVIS WITHOUT CONTRAST TECHNIQUE: Multidetector CT imaging  of the abdomen and pelvis was performed following the standard protocol without IV contrast. COMPARISON:  11/10/2013 FINDINGS: Lower chest: No acute abnormality. Hepatobiliary: No focal liver abnormality is seen. Status post cholecystectomy. No biliary dilatation. Pancreas: Unremarkable. No pancreatic ductal dilatation or surrounding  inflammatory changes. Spleen: Normal in size without significant abnormality. Adrenals/Urinary Tract: Adrenal glands are unremarkable. Kidneys are normal, without renal calculi, solid lesion, or hydronephrosis. Bladder is unremarkable. Stomach/Bowel: Stomach is within normal limits. Appendix appears normal. No evidence of bowel wall thickening, distention, or inflammatory changes. Vascular/Lymphatic: Scattered aortic atherosclerosis. No enlarged abdominal or pelvic lymph nodes. Reproductive: Status post hysterectomy. Other: No abdominal wall hernia or abnormality. No abdominopelvic ascites. Musculoskeletal: No acute or significant osseous findings. IMPRESSION: 1. No non-contrast CT findings of the abdomen or pelvis to explain pain. No evidence of bowel obstruction. 2. Status post cholecystectomy and hysterectomy. 3. Aortic Atherosclerosis (ICD10-I70.0). Electronically Signed   By: Eddie Candle M.D.   On: 03/07/2020 12:27    Procedures Procedures (including critical care time)  Medications Ordered in ED Medications  heparin injection 5,000 Units (5,000 Units Subcutaneous Given 03/07/20 1423)  0.9 %  sodium chloride infusion ( Intravenous New Bag/Given 03/07/20 1423)  oxyCODONE (Oxy IR/ROXICODONE) immediate release tablet 5 mg (has no administration in time range)  ondansetron (ZOFRAN) tablet 4 mg ( Oral See Alternative 03/07/20 1423)    Or  ondansetron (ZOFRAN) injection 4 mg (4 mg Intravenous Given 03/07/20 1423)  morphine 2 MG/ML injection 2 mg (2 mg Intravenous Given 03/07/20 1437)  albuterol (PROVENTIL) (2.5 MG/3ML) 0.083% nebulizer solution 2.5 mg (has no administration in time range)  insulin glargine (LANTUS) injection 25 Units (25 Units Subcutaneous Given 03/07/20 1703)  insulin aspart (novoLOG) injection 0-15 Units (15 Units Subcutaneous Given 03/07/20 1703)  insulin aspart (novoLOG) injection 0-5 Units (has no administration in time range)  metoprolol tartrate (LOPRESSOR) injection 5 mg (5 mg  Intravenous Given 03/07/20 1703)  feeding supplement (BOOST / RESOURCE BREEZE) liquid 1 Container (has no administration in time range)  promethazine (PHENERGAN) injection 12.5 mg (12.5 mg Intravenous Given 03/07/20 1704)  metoCLOPramide (REGLAN) injection 10 mg (10 mg Intravenous Given 03/07/20 1704)  sodium chloride 0.9 % bolus 1,000 mL (0 mLs Intravenous Stopped 03/07/20 1015)  ondansetron (ZOFRAN) injection 4 mg (4 mg Intravenous Given 03/07/20 0822)  fentaNYL (SUBLIMAZE) injection 100 mcg (100 mcg Intravenous Given 03/07/20 0822)  promethazine (PHENERGAN) injection 25 mg ( Intravenous Duplicate 09/28/57 4585)  sodium chloride 0.9 % bolus 1,000 mL (0 mLs Intravenous Stopped 03/07/20 1228)  ondansetron (ZOFRAN) injection 4 mg (4 mg Intravenous Given 03/07/20 1026)  fentaNYL (SUBLIMAZE) injection 100 mcg (100 mcg Intravenous Given 03/07/20 1118)    ED Course  I have reviewed the triage vital signs and the nursing notes.  Pertinent labs & imaging results that were available during my care of the patient were reviewed by me and considered in my medical decision making (see chart for details).  Clinical Course as of Mar 07 1724  Sun Mar 07, 2020  1008 Patient states pain is improved but still very nauseous.  First liter infused.  Will try Zofran again.  Labs showing an AKI elevated glucose, elevation in LFTs.  Patient status post cholecystectomy.  pH is normal.  Does have a gap.   [MB]  1054 Reviewed case with [facility] Dr. Ardis Hughs.  He said that the LFTs would bear watching, could be viral process.  He asked if the patient does get admitted to the hospital that  the hospitalist team formally consult them   [MB]  1120 Discussed with Dr. Maryland Pink -[facility] hospitalist at [facility] long.  He will put in bed for progressive.  Recommends get another be met and if Still not closing then consideration for treatment more`Like DKA.  Also asked if can get CT imaging with the understanding of limitation with no IV contrast.   Doubt she will be able to take much oral but will try.   [MB]    Clinical Course User Index [MB] Hayden Rasmussen, MD   MDM Rules/Calculators/A&P                         This patient complains of nausea vomiting diarrhea abdominal pain dehydration; this involves an extensive number of treatment Options and is a complaint that carries with it a high risk of complications and Morbidity. The differential includes gastroenteritis, metabolic derangement, AKI, DKA, obstruction, infection  I ordered, reviewed and interpreted labs, which included CBC with elevated white count, stable hemoglobin, chemistries with elevated blood sugar and new AKI, has a gap.  Covid testing negative.  ABG showing normal pH.  Urinalysis showing some ketones and spilling glucose.  Feel like this is more dehydration and elevated blood sugars versus DKA. I ordered medication IV fluids, IV pain medicine and nausea medication.  On repeat BMP her gap had closed. I ordered imaging studies which included CT abdomen and pelvis and I independently    visualized and interpreted imaging which showed no acute findings Additional history obtained from patient's sister Previous records obtained and reviewed in epic, no recent visits for similar visitations. I consulted Dr. Maryland Pink [facility] hospitalist and discussed lab and imaging findings  Critical Interventions: None  After the interventions stated above, I reevaluated the patient and found patient still to be symptomatic and with an acute AKI and not being able to take adequate p.o. Will need admission for further hydration.  There may be an element of DKA so this will also need to be monitored.  Patient in agreement for admission.   Final Clinical Impression(s) / ED Diagnoses Final diagnoses:  Nausea vomiting and diarrhea  AKI (acute kidney injury) ([place])  Hyperglycemia  Abdominal pain, epigastric    Rx / DC Orders ED Discharge Orders    None       Hayden Rasmussen,  MD 03/07/20 1733

## 2020-03-07 NOTE — ED Notes (Signed)
Report attempted. RN to call back when available.

## 2020-03-08 DIAGNOSIS — R1013 Epigastric pain: Secondary | ICD-10-CM | POA: Diagnosis not present

## 2020-03-08 DIAGNOSIS — D573 Sickle-cell trait: Secondary | ICD-10-CM | POA: Diagnosis present

## 2020-03-08 DIAGNOSIS — Z79899 Other long term (current) drug therapy: Secondary | ICD-10-CM | POA: Diagnosis not present

## 2020-03-08 DIAGNOSIS — H409 Unspecified glaucoma: Secondary | ICD-10-CM | POA: Diagnosis present

## 2020-03-08 DIAGNOSIS — E1110 Type 2 diabetes mellitus with ketoacidosis without coma: Principal | ICD-10-CM

## 2020-03-08 DIAGNOSIS — Z87891 Personal history of nicotine dependence: Secondary | ICD-10-CM | POA: Diagnosis not present

## 2020-03-08 DIAGNOSIS — I10 Essential (primary) hypertension: Secondary | ICD-10-CM

## 2020-03-08 DIAGNOSIS — Z90710 Acquired absence of both cervix and uterus: Secondary | ICD-10-CM | POA: Diagnosis not present

## 2020-03-08 DIAGNOSIS — E1122 Type 2 diabetes mellitus with diabetic chronic kidney disease: Secondary | ICD-10-CM | POA: Diagnosis present

## 2020-03-08 DIAGNOSIS — E1143 Type 2 diabetes mellitus with diabetic autonomic (poly)neuropathy: Secondary | ICD-10-CM | POA: Diagnosis present

## 2020-03-08 DIAGNOSIS — Z794 Long term (current) use of insulin: Secondary | ICD-10-CM | POA: Diagnosis not present

## 2020-03-08 DIAGNOSIS — A059 Bacterial foodborne intoxication, unspecified: Secondary | ICD-10-CM | POA: Diagnosis present

## 2020-03-08 DIAGNOSIS — Z888 Allergy status to other drugs, medicaments and biological substances status: Secondary | ICD-10-CM | POA: Diagnosis not present

## 2020-03-08 DIAGNOSIS — I129 Hypertensive chronic kidney disease with stage 1 through stage 4 chronic kidney disease, or unspecified chronic kidney disease: Secondary | ICD-10-CM | POA: Diagnosis present

## 2020-03-08 DIAGNOSIS — D509 Iron deficiency anemia, unspecified: Secondary | ICD-10-CM | POA: Diagnosis present

## 2020-03-08 DIAGNOSIS — K2970 Gastritis, unspecified, without bleeding: Secondary | ICD-10-CM | POA: Diagnosis present

## 2020-03-08 DIAGNOSIS — R112 Nausea with vomiting, unspecified: Secondary | ICD-10-CM | POA: Diagnosis not present

## 2020-03-08 DIAGNOSIS — E7800 Pure hypercholesterolemia, unspecified: Secondary | ICD-10-CM | POA: Diagnosis present

## 2020-03-08 DIAGNOSIS — Z9049 Acquired absence of other specified parts of digestive tract: Secondary | ICD-10-CM | POA: Diagnosis not present

## 2020-03-08 DIAGNOSIS — E870 Hyperosmolality and hypernatremia: Secondary | ICD-10-CM | POA: Diagnosis present

## 2020-03-08 DIAGNOSIS — K3184 Gastroparesis: Secondary | ICD-10-CM | POA: Diagnosis present

## 2020-03-08 DIAGNOSIS — N1832 Chronic kidney disease, stage 3b: Secondary | ICD-10-CM | POA: Diagnosis present

## 2020-03-08 DIAGNOSIS — N179 Acute kidney failure, unspecified: Secondary | ICD-10-CM | POA: Diagnosis present

## 2020-03-08 DIAGNOSIS — R7401 Elevation of levels of liver transaminase levels: Secondary | ICD-10-CM | POA: Diagnosis present

## 2020-03-08 DIAGNOSIS — K529 Noninfective gastroenteritis and colitis, unspecified: Secondary | ICD-10-CM | POA: Diagnosis present

## 2020-03-08 DIAGNOSIS — J45909 Unspecified asthma, uncomplicated: Secondary | ICD-10-CM | POA: Diagnosis present

## 2020-03-08 DIAGNOSIS — Z20822 Contact with and (suspected) exposure to covid-19: Secondary | ICD-10-CM | POA: Diagnosis present

## 2020-03-08 LAB — HEMOGLOBIN A1C
Hgb A1c MFr Bld: 9.2 % — ABNORMAL HIGH (ref 4.8–5.6)
Mean Plasma Glucose: 217.34 mg/dL

## 2020-03-08 LAB — COMPREHENSIVE METABOLIC PANEL
ALT: 149 U/L — ABNORMAL HIGH (ref 0–44)
AST: 34 U/L (ref 15–41)
Albumin: 3.8 g/dL (ref 3.5–5.0)
Alkaline Phosphatase: 243 U/L — ABNORMAL HIGH (ref 38–126)
Anion gap: 10 (ref 5–15)
BUN: 44 mg/dL — ABNORMAL HIGH (ref 6–20)
CO2: 23 mmol/L (ref 22–32)
Calcium: 9.3 mg/dL (ref 8.9–10.3)
Chloride: 114 mmol/L — ABNORMAL HIGH (ref 98–111)
Creatinine, Ser: 2.16 mg/dL — ABNORMAL HIGH (ref 0.44–1.00)
GFR calc Af Amer: 29 mL/min — ABNORMAL LOW (ref >60)
GFR calc non Af Amer: 25 mL/min — ABNORMAL LOW (ref >60)
Glucose, Bld: 143 mg/dL — ABNORMAL HIGH (ref 70–99)
Potassium: 3.8 mmol/L (ref 3.5–5.1)
Sodium: 147 mmol/L — ABNORMAL HIGH (ref 135–145)
Total Bilirubin: 0.8 mg/dL (ref 0.3–1.2)
Total Protein: 7.9 g/dL (ref 6.5–8.1)

## 2020-03-08 LAB — CBC
HCT: 35.9 % — ABNORMAL LOW (ref 36.0–46.0)
Hemoglobin: 11 g/dL — ABNORMAL LOW (ref 12.0–15.0)
MCH: 25.3 pg — ABNORMAL LOW (ref 26.0–34.0)
MCHC: 30.6 g/dL (ref 30.0–36.0)
MCV: 82.5 fL (ref 80.0–100.0)
Platelets: 283 10*3/uL (ref 150–400)
RBC: 4.35 MIL/uL (ref 3.87–5.11)
RDW: 16.1 % — ABNORMAL HIGH (ref 11.5–15.5)
WBC: 11.6 10*3/uL — ABNORMAL HIGH (ref 4.0–10.5)
nRBC: 0 % (ref 0.0–0.2)

## 2020-03-08 LAB — HEPATITIS PANEL, ACUTE
HCV Ab: NONREACTIVE
HCV Ab: NONREACTIVE
Hep A IgM: NONREACTIVE
Hep A IgM: NONREACTIVE
Hep B C IgM: NONREACTIVE
Hep B C IgM: NONREACTIVE
Hepatitis B Surface Ag: NONREACTIVE
Hepatitis B Surface Ag: NONREACTIVE

## 2020-03-08 LAB — HIV ANTIBODY (ROUTINE TESTING W REFLEX): HIV Screen 4th Generation wRfx: NONREACTIVE

## 2020-03-08 LAB — GLUCOSE, CAPILLARY
Glucose-Capillary: 164 mg/dL — ABNORMAL HIGH (ref 70–99)
Glucose-Capillary: 167 mg/dL — ABNORMAL HIGH (ref 70–99)
Glucose-Capillary: 141 mg/dL — ABNORMAL HIGH (ref 70–99)
Glucose-Capillary: 105 mg/dL — ABNORMAL HIGH (ref 70–99)

## 2020-03-08 MED ORDER — AMLODIPINE BESYLATE 5 MG PO TABS
5.0000 mg | ORAL_TABLET | Freq: Every day | ORAL | Status: DC
  Administered 2020-03-08 – 2020-03-09 (×2): 5 mg via ORAL
  Filled 2020-03-08 (×2): qty 1

## 2020-03-08 MED ORDER — CALCIUM CARBONATE ANTACID 500 MG PO CHEW
1.0000 | CHEWABLE_TABLET | Freq: Two times a day (BID) | ORAL | Status: DC
  Administered 2020-03-10 – 2020-03-12 (×5): 200 mg via ORAL
  Filled 2020-03-08 (×7): qty 1

## 2020-03-08 MED ORDER — METOCLOPRAMIDE HCL 5 MG/ML IJ SOLN
5.0000 mg | Freq: Once | INTRAMUSCULAR | Status: AC
  Administered 2020-03-08: 5 mg via INTRAVENOUS
  Filled 2020-03-08: qty 2

## 2020-03-08 MED ORDER — PANTOPRAZOLE SODIUM 40 MG IV SOLR
40.0000 mg | Freq: Every day | INTRAVENOUS | Status: DC
  Administered 2020-03-08 – 2020-03-09 (×2): 40 mg via INTRAVENOUS
  Filled 2020-03-08 (×2): qty 40

## 2020-03-08 MED ORDER — HYDRALAZINE HCL 20 MG/ML IJ SOLN
10.0000 mg | Freq: Four times a day (QID) | INTRAMUSCULAR | Status: DC | PRN
  Administered 2020-03-08 – 2020-03-09 (×2): 10 mg via INTRAVENOUS
  Filled 2020-03-08 (×2): qty 1

## 2020-03-08 MED ORDER — SODIUM CHLORIDE 0.9 % IV SOLN: INTRAVENOUS | Status: AC

## 2020-03-08 MED ORDER — PROSOURCE PLUS PO LIQD
30.0000 mL | Freq: Two times a day (BID) | ORAL | Status: DC
  Administered 2020-03-12: 30 mL via ORAL
  Filled 2020-03-08 (×5): qty 30

## 2020-03-08 NOTE — Progress Notes (Addendum)
PROGRESS NOTE    Kirsten Shaffer  WUJ:[id number]  DOB: Jan 16, 1966  PCP: System, Pcp Not In Admit date:03/07/2020 Chief compliant: Nausea vomiting, abdominal pain 54 y.o. female with h/o DM on insulin, HTN, CKD3b who ate out on Wednesday with her son.  Both of them got sick.  He got better however her symptoms persisted.  She has had multiple episodes of vomiting, initially emesis of food followed by clear liquid.  Denies any blood in the emesis.  She also had loose stools but not watery.  Denies any blood in the stool.  She also mentioned diffuse abdominal pain but more in the upper abdomen.  No fever or chills.  She last took her insulin on Wednesday or Thursday.  She was scared of her glucose levels dropping too much and so she did not take her insulin the last few days. She decided to come into the emergency department due to persistent symptoms. ED Course:In the emergency department she was found to have mild DKA with an anion gap of 18 and a glucose level of 462.  She was also found to have AKI, transaminitis.  She is status post cholecystectomy previously.   Hospital course: Patient admitted to [facility] for further management.  Subjective:  Patient resting comfortably, no acute complaints.  Objective: Vitals:   03/07/20 2232 03/08/20 0100 03/08/20 0448 03/08/20 1338  BP: (!) 141/66 135/65 (!) 150/69 (!) 210/102  Pulse: 84 85 87 98  Resp: 20 20 16    Temp: 98.8 F (37.1 C) 99.1 F (37.3 C) 99 F (37.2 C) 98.5 F (36.9 C)  TempSrc: Oral Oral Oral Oral  SpO2: 100% 96% 95% 98%  Weight:      Height:        Intake/Output Summary (Last 24 hours) at 03/08/2020 1526 Last data filed at 03/08/2020 0400 Gross per 24 hour  Intake 318.67 ml  Output 1850 ml  Net -1531.33 ml   Filed Weights   03/07/20 1306  Weight: 117.7 kg    Physical Examination:  General: Moderately built, no acute distress noted Head ENT: Atraumatic normocephalic, PERRLA, neck supple Heart: S1-S2 heard, regular rate  and rhythm, no murmurs.  No leg edema noted Lungs: Equal air entry bilaterally, no rhonchi or rales on exam, no accessory muscle use Abdomen: Bowel sounds heard, soft, tender in epigastrium and midabdominal area, nondistended. No organomegaly.  No CVA tenderness Extremities: No pedal edema.  No cyanosis or clubbing. Neurological: Awake alert oriented x3, no focal weakness or numbness, strength and sensations to crude touch intact Skin: No wounds or rashes.  Data Reviewed: I have personally reviewed following labs and imaging studies  CBC: Recent Labs  Lab 03/05/20 0018 03/07/20 0807 03/07/20 0839 03/08/20 0539  WBC 10.4 15.1*  --  11.6*  NEUTROABS  --  12.5*  --   --   HGB 10.2* 11.7* 11.9* 11.0*  HCT 32.3* 36.7 35.0* 35.9*  MCV 78.2* 77.8*  --  82.5  PLT 250 313  --  956   Basic Metabolic Panel: Recent Labs  Lab 03/05/20 0018 03/07/20 0807 03/07/20 0839 03/07/20 1230 03/08/20 0539  NA 137 138 140 141 147*  K 4.9 4.2 4.1 4.0 3.8  CL 105 100  --  107 114*  CO2 19* 20*  --  22 23  GLUCOSE 232* 462*  --  390* 143*  BUN 23* 50*  --  45* 44*  CREATININE 1.46* 2.56*  --  2.36* 2.16*  CALCIUM 9.6 9.7  --  8.6* 9.3   GFR: Estimated Creatinine Clearance: 39.5 mL/min (A) (by C-G formula based on SCr of 2.16 mg/dL (H)). Liver Function Tests: Recent Labs  Lab 03/07/20 0807 03/08/20 0539  AST 89* 34  ALT 234* 149*  ALKPHOS 324* 243*  BILITOT 0.9 0.8  PROT 8.7* 7.9  ALBUMIN 4.0 3.8   Recent Labs  Lab 03/07/20 0807  LIPASE 31   No results for input(s): AMMONIA in the last 168 hours. Coagulation Profile: No results for input(s): INR, PROTIME in the last 168 hours. Cardiac Enzymes: No results for input(s): CKTOTAL, CKMB, CKMBINDEX, TROPONINI in the last 168 hours. BNP (last 3 results) No results for input(s): PROBNP in the last 8760 hours. HbA1C: Recent Labs    03/07/20 0807 03/08/20 0509  HGBA1C 8.8* 9.2*   CBG: Recent Labs  Lab 03/07/20 1214 03/07/20 1628  03/07/20 2229 03/08/20 0812 03/08/20 1158  GLUCAP 372* 365* 112* 164* 167*   Lipid Profile: No results for input(s): CHOL, HDL, LDLCALC, TRIG, CHOLHDL, LDLDIRECT in the last 72 hours. Thyroid Function Tests: No results for input(s): TSH, T4TOTAL, FREET4, T3FREE, THYROIDAB in the last 72 hours. Anemia Panel: No results for input(s): VITAMINB12, FOLATE, FERRITIN, TIBC, IRON, RETICCTPCT in the last 72 hours. Sepsis Labs: No results for input(s): PROCALCITON, LATICACIDVEN in the last 168 hours.  Recent Results (from the past 240 hour(s))  SARS Coronavirus 2 by RT PCR (hospital order, performed in [facility] hospital lab) Nasopharyngeal Nasopharyngeal Swab     Status: None   Collection Time: 03/07/20  8:51 AM   Specimen: Nasopharyngeal Swab  Result Value Ref Range Status   SARS Coronavirus 2 NEGATIVE NEGATIVE Final    Comment: (NOTE) SARS-CoV-2 target nucleic acids are NOT DETECTED.  The SARS-CoV-2 RNA is generally detectable in upper and lower respiratory specimens during the acute phase of infection. The lowest concentration of SARS-CoV-2 viral copies this assay can detect is 250 copies / mL. A negative result does not preclude SARS-CoV-2 infection and should not be used as the sole basis for treatment or other patient management decisions.  A negative result may occur with improper specimen collection / handling, submission of specimen other than nasopharyngeal swab, presence of viral mutation(s) within the areas targeted by this assay, and inadequate number of viral copies (<250 copies / mL). A negative result must be combined with clinical observations, patient history, and epidemiological information.  Fact Sheet for Patients:   StrictlyIdeas.no  Fact Sheet for Healthcare Providers: BankingDealers.co.za  This test is not yet approved or  cleared by the [place] FDA and has been authorized for detection and/or diagnosis of  SARS-CoV-2 by FDA under an Emergency Use Authorization (EUA).  This EUA will remain in effect (meaning this test can be used) for the duration of the COVID-19 declaration under Section 564(b)(1) of the Act, 21 U.S.C. section 360bbb-3(b)(1), unless the authorization is terminated or revoked sooner.  Performed at [facility], [address] [place], [place] [address]       Radiology Studies: CT Abdomen Pelvis Wo Contrast  Result Date: 03/07/2020 CLINICAL DATA:  Bowel obstruction suspected EXAM: CT ABDOMEN AND PELVIS WITHOUT CONTRAST TECHNIQUE: Multidetector CT imaging of the abdomen and pelvis was performed following the standard protocol without IV contrast. COMPARISON:  11/10/2013 FINDINGS: Lower chest: No acute abnormality. Hepatobiliary: No focal liver abnormality is seen. Status post cholecystectomy. No biliary dilatation. Pancreas: Unremarkable. No pancreatic ductal dilatation or surrounding inflammatory changes. Spleen: Normal in size without significant abnormality. Adrenals/Urinary  Tract: Adrenal glands are unremarkable. Kidneys are normal, without renal calculi, solid lesion, or hydronephrosis. Bladder is unremarkable. Stomach/Bowel: Stomach is within normal limits. Appendix appears normal. No evidence of bowel wall thickening, distention, or inflammatory changes. Vascular/Lymphatic: Scattered aortic atherosclerosis. No enlarged abdominal or pelvic lymph nodes. Reproductive: Status post hysterectomy. Other: No abdominal wall hernia or abnormality. No abdominopelvic ascites. Musculoskeletal: No acute or significant osseous findings. IMPRESSION: 1. No non-contrast CT findings of the abdomen or pelvis to explain pain. No evidence of bowel obstruction. 2. Status post cholecystectomy and hysterectomy. 3. Aortic Atherosclerosis (ICD10-I70.0). Electronically Signed   By: Eddie Candle M.D.   On: 03/07/2020 12:27      Scheduled Meds: . (feeding supplement) PROSource Plus  30 mL Oral  BID BM  . feeding supplement  1 Container Oral TID BM  . heparin  5,000 Units Subcutaneous Q8H  . insulin aspart  0-15 Units Subcutaneous TID WC  . insulin aspart  0-5 Units Subcutaneous QHS  . insulin glargine  25 Units Subcutaneous Daily  . metoCLOPramide (REGLAN) injection  10 mg Intravenous Q6H   Continuous Infusions:    Assessment/Plan:  1.  Acute gastroenteritis: Could be related to food poisoning as family member was also sick.  Diarrhea now resolved.  Persistent nausea could be related to diabetic gastroparesis.  Continue IV Reglan every 6 hours.  Monitor QT interval.  She is s/p cholecystectomy.  CT abdomen/pelvis on admission unremarkable.  Will keep n.p.o. for today.  May need GI evaluation if nausea persists.  Add IV PPI, Tums for possible gastritis  2.  Mild DKA: Present on admission in the setting of not taking insulin for 3 to 4 days and poor oral intake.  Now transitioned off insulin drip and on Lantus insulin.  Oral intake remains poor given above., monitor for hypoglycemia.  Hemoglobin A1c 9.2  3.  AKI on CKD stage IIIb: Baseline creatinine appears to be around 1.5.  On presentation creatinine elevated to 2.56 and BUN at 50.  AKI likely related to HCTZ use and GI losses.  Will resume IV hydration given poor oral intake, continue to monitor creatinine which appears to be downtrending.  4.  Transaminitis: Probably in the setting of problem #1.  Continue to trend.  Hepatitis profile negative.  5.  Hypertension: Hold HCTZ in the setting of problem #3.  Blood pressure elevated today in the setting of discomfort/nausea.  Will add norvasc and hydralazine as needed  6.  Microcytic anemia: Hemoglobin appears stable around 11.  No signs of bleeding  DVT prophylaxis: Heparin Code Status: Full code Family / Patient Communication: Discussed with patient and all questions answered to satisfaction Disposition Plan:   Status is: Observation  The patient will require care spanning > 2  midnights and should be moved to inpatient because: Persistent severe electrolyte disturbances, AKI, unable to tolerate p.o., IV fluids, persistent nausea  Dispo: The patient is from: Home              Anticipated d/c is to: Home              Anticipated d/c date is: 3 days              Patient currently is not medically stable to d/c.          Time spent: 25 minutes     >50% time spent in discussions with care team and coordination of care.    Guilford Shi, MD [facility] Pager in [place]  If 7PM-7AM, please contact night-coverage [email or web address] 03/08/2020, 3:26 PM

## 2020-03-08 NOTE — Progress Notes (Signed)
Initial Nutrition Assessment  DOCUMENTATION CODES:   Morbid obesity  INTERVENTION:  - continue Boost Breeze TID, each supplement provides 250 kcal and 9 grams of protein. - will order 30 mL Prosource Plus BID, each supplement provides 100 kcal and 15 grams of protein. - will order 1 tablet multivitamin with minerals. - diet advancement as medically feasible.  NUTRITION DIAGNOSIS:   Inadequate protein intake related to other (see comment) (current diet order) as evidenced by other (comment) (CLD does not meet estimated protein need.).  GOAL:   Patient will meet greater than or equal to 90% of their needs  MONITOR:   PO intake, Supplement acceptance, Diet advancement, Labs, Weight trends  REASON FOR ASSESSMENT:   Malnutrition Screening Tool    ASSESSMENT:   54 y.o. female with medical history of DM on insulin, essential HTN, and stage 3 CKD. She went out to eat with her son on 8/4 and both of them got sick after the meal. Her son got better but she has had persistent N/V since that time. She has also been having loose (not watery) stools and diffuse abdominal pain, mainly in the upper abdomen. She has not taken her insulin since 8/4 or 8/5. In the ED, she was found to have mild DKA, transaminitis, and AKI.  She has been on CLD since admission. Patient has been unable to tolerate more than a few bites of solid food since eating out with her son on 8/4. Nausea has been improving since admission and she has not had any episodes of vomiting since PTA.   She was not taking insulin since 8/4 evening d/t concern for hypoglycemia with poor oral intakes.   Weight has been stable for the past 4 weight recordings, which stretch back to 03/31/14.  Boost Breeze was ordered TID yesterday and patient has accepted 2 of the 3 cartons of supplement offered so far.   Per notes: - mild DKA with HgbA1c in 10/2019 of 9.9% - acute gastroenteritis--possible eval for gastroparesis outpatient - AKI on  stage 3 CKD - transaminitis - microcytic anemia   Labs reviewed; CBGs: 164 and 167 mg/dl, Na: 147 mmol/l, Cl: 114 mmol/l, BUN: 44 mg/dl, creatinine: 2.16 mg/dl, Alk Phos elevated, GFR: 25 ml/min.  Medications reviewed; sliding scale novolog, 25 units lantus/day, 10 mg IV reglan QID.     NUTRITION - FOCUSED PHYSICAL EXAM:  no muscle or fat depletions.   Diet Order:   Diet Order            Diet clear liquid Room service appropriate? Yes; Fluid consistency: Thin  Diet effective now                 EDUCATION NEEDS:   No education needs have been identified at this time  Skin:  Skin Assessment: Reviewed RN Assessment  Last BM:  PTA/unknown  Height:   Ht Readings from Last 1 Encounters:  03/07/20 5' 7"  (1.702 m)    Weight:   Wt Readings from Last 1 Encounters:  03/07/20 117.7 kg     Estimated Nutritional Needs:  Kcal:  1700-1900 kcal Protein:  90-100 grams Fluid:  >/= 2.2 L/day     Jarome Matin, MS, RD, LDN, CNSC Inpatient Clinical Dietitian RD pager # available in AMION  After hours/weekend pager # available in [facility]

## 2020-03-09 LAB — GLUCOSE, CAPILLARY
Glucose-Capillary: 187 mg/dL — ABNORMAL HIGH (ref 70–99)
Glucose-Capillary: 140 mg/dL — ABNORMAL HIGH (ref 70–99)
Glucose-Capillary: 170 mg/dL — ABNORMAL HIGH (ref 70–99)
Glucose-Capillary: 36 mg/dL — CL (ref 70–99)
Glucose-Capillary: 175 mg/dL — ABNORMAL HIGH (ref 70–99)

## 2020-03-09 MED ORDER — AMLODIPINE BESYLATE 10 MG PO TABS
10.0000 mg | ORAL_TABLET | Freq: Every day | ORAL | Status: DC
  Administered 2020-03-10 – 2020-03-12 (×3): 10 mg via ORAL
  Filled 2020-03-09 (×3): qty 1

## 2020-03-09 MED ORDER — AMLODIPINE BESYLATE 5 MG PO TABS
5.0000 mg | ORAL_TABLET | Freq: Once | ORAL | Status: AC
  Administered 2020-03-09: 5 mg via ORAL
  Filled 2020-03-09: qty 1

## 2020-03-09 MED ORDER — DEXTROSE 50 % IV SOLN
INTRAVENOUS | Status: AC
  Administered 2020-03-09: 50 mL
  Filled 2020-03-09: qty 50

## 2020-03-09 NOTE — Progress Notes (Signed)
CRITICAL VALUE ALERT  Critical Value:  CBG 36  Date & Time Notied:  03/09/20 & 4975  Provider Notified: Earnest Conroy  Orders Received/Actions taken: Hypoglycemic Protocol followed. Dextrose 50% administered IV. Repeat CBG 170. Will continue to monitor.

## 2020-03-09 NOTE — Progress Notes (Signed)
PROGRESS NOTE    Kirsten Shaffer  SEG:[id number]  DOB: 1965-09-04  PCP: System, Pcp Not In Admit date:03/07/2020 Chief compliant: Nausea vomiting, abdominal pain 54 y.o. female with h/o DM on insulin, HTN, CKD3b who ate out on Wednesday with her son.  Both of them got sick.  He got better however her symptoms persisted.  She has had multiple episodes of vomiting, initially emesis of food followed by clear liquid.  Denies any blood in the emesis.  She also had loose stools but not watery.  Denies any blood in the stool.  She also mentioned diffuse abdominal pain but more in the upper abdomen.  No fever or chills.  She last took her insulin on Wednesday or Thursday.  She was scared of her glucose levels dropping too much and so she did not take her insulin the last few days. She decided to come into the emergency department due to persistent symptoms. ED Course:In the emergency department she was found to have mild DKA with an anion gap of 18 and a glucose level of 462.  She was also found to have AKI, transaminitis.  She is status post cholecystectomy previously.   Hospital course: Patient admitted to [facility] for further management.  Subjective:  Patient resting comfortably.  Feels much better today in terms of nausea.  Feels ready to try liquid diet.  Blood pressure elevated with systolic 1 73-7 10G.  Objective: Vitals:   03/08/20 2108 03/08/20 2154 03/09/20 0554 03/09/20 0650  BP: (!) 177/74 (!) 163/64 (!) 191/98 138/66  Pulse: 99 (!) 106 (!) 108 (!) 108  Resp: 16  16   Temp: 99.2 F (37.3 C)  99 F (37.2 C)   TempSrc: Oral  Oral   SpO2: 96%  95%   Weight:      Height:        Intake/Output Summary (Last 24 hours) at 03/09/2020 1437 Last data filed at 03/09/2020 0600 Gross per 24 hour  Intake 1026.25 ml  Output --  Net 1026.25 ml   Filed Weights   03/07/20 1306  Weight: 117.7 kg    Physical Examination:  General: Moderately built, no acute distress noted Head ENT: Atraumatic  normocephalic, PERRLA, neck supple Heart: S1-S2 heard, regular rate and rhythm, no murmurs.  No leg edema noted Lungs: Equal air entry bilaterally, no rhonchi or rales on exam, no accessory muscle use Abdomen: Bowel sounds heard, soft, mild epigastric tenderness, nondistended. No organomegaly.  No CVA tenderness Extremities: No pedal edema.  No cyanosis or clubbing. Neurological: Awake alert oriented x3, no focal weakness or numbness, strength and sensations to crude touch intact Skin: No wounds or rashes.  Data Reviewed: I have personally reviewed following labs and imaging studies  CBC: Recent Labs  Lab 03/05/20 0018 03/07/20 0807 03/07/20 0839 03/08/20 0539  WBC 10.4 15.1*  --  11.6*  NEUTROABS  --  12.5*  --   --   HGB 10.2* 11.7* 11.9* 11.0*  HCT 32.3* 36.7 35.0* 35.9*  MCV 78.2* 77.8*  --  82.5  PLT 250 313  --  269   Basic Metabolic Panel: Recent Labs  Lab 03/05/20 0018 03/07/20 0807 03/07/20 0839 03/07/20 1230 03/08/20 0539  NA 137 138 140 141 147*  K 4.9 4.2 4.1 4.0 3.8  CL 105 100  --  107 114*  CO2 19* 20*  --  22 23  GLUCOSE 232* 462*  --  390* 143*  BUN 23* 50*  --  45* 44*  CREATININE 1.46* 2.56*  --  2.36* 2.16*  CALCIUM 9.6 9.7  --  8.6* 9.3   GFR: Estimated Creatinine Clearance: 39.5 mL/min (A) (by C-G formula based on SCr of 2.16 mg/dL (H)). Liver Function Tests: Recent Labs  Lab 03/07/20 0807 03/08/20 0539  AST 89* 34  ALT 234* 149*  ALKPHOS 324* 243*  BILITOT 0.9 0.8  PROT 8.7* 7.9  ALBUMIN 4.0 3.8   Recent Labs  Lab 03/07/20 0807  LIPASE 31   No results for input(s): AMMONIA in the last 168 hours. Coagulation Profile: No results for input(s): INR, PROTIME in the last 168 hours. Cardiac Enzymes: No results for input(s): CKTOTAL, CKMB, CKMBINDEX, TROPONINI in the last 168 hours. BNP (last 3 results) No results for input(s): PROBNP in the last 8760 hours. HbA1C: Recent Labs    03/07/20 0807 03/08/20 0509  HGBA1C 8.8* 9.2*    CBG: Recent Labs  Lab 03/08/20 1158 03/08/20 1606 03/08/20 2016 03/09/20 0824 03/09/20 1154  GLUCAP 167* 141* 105* 187* 140*   Lipid Profile: No results for input(s): CHOL, HDL, LDLCALC, TRIG, CHOLHDL, LDLDIRECT in the last 72 hours. Thyroid Function Tests: No results for input(s): TSH, T4TOTAL, FREET4, T3FREE, THYROIDAB in the last 72 hours. Anemia Panel: No results for input(s): VITAMINB12, FOLATE, FERRITIN, TIBC, IRON, RETICCTPCT in the last 72 hours. Sepsis Labs: No results for input(s): PROCALCITON, LATICACIDVEN in the last 168 hours.  Recent Results (from the past 240 hour(s))  SARS Coronavirus 2 by RT PCR (hospital order, performed in [facility] hospital lab) Nasopharyngeal Nasopharyngeal Swab     Status: None   Collection Time: 03/07/20  8:51 AM   Specimen: Nasopharyngeal Swab  Result Value Ref Range Status   SARS Coronavirus 2 NEGATIVE NEGATIVE Final    Comment: (NOTE) SARS-CoV-2 target nucleic acids are NOT DETECTED.  The SARS-CoV-2 RNA is generally detectable in upper and lower respiratory specimens during the acute phase of infection. The lowest concentration of SARS-CoV-2 viral copies this assay can detect is 250 copies / mL. A negative result does not preclude SARS-CoV-2 infection and should not be used as the sole basis for treatment or other patient management decisions.  A negative result may occur with improper specimen collection / handling, submission of specimen other than nasopharyngeal swab, presence of viral mutation(s) within the areas targeted by this assay, and inadequate number of viral copies (<250 copies / mL). A negative result must be combined with clinical observations, patient history, and epidemiological information.  Fact Sheet for Patients:   StrictlyIdeas.no  Fact Sheet for Healthcare Providers: BankingDealers.co.za  This test is not yet approved or  cleared by the [place] FDA  and has been authorized for detection and/or diagnosis of SARS-CoV-2 by FDA under an Emergency Use Authorization (EUA).  This EUA will remain in effect (meaning this test can be used) for the duration of the COVID-19 declaration under Section 564(b)(1) of the Act, 21 U.S.C. section 360bbb-3(b)(1), unless the authorization is terminated or revoked sooner.  Performed at [facility], [address], [place], [place] [address]       Radiology Studies: No results found.    Scheduled Meds: . (feeding supplement) PROSource Plus  30 mL Oral BID BM  . amLODipine  5 mg Oral Daily  . calcium carbonate  1 tablet Oral BID  . feeding supplement  1 Container Oral TID BM  . heparin  5,000 Units Subcutaneous Q8H  . insulin aspart  0-15 Units Subcutaneous TID WC  .  insulin aspart  0-5 Units Subcutaneous QHS  . insulin glargine  25 Units Subcutaneous Daily  . metoCLOPramide (REGLAN) injection  10 mg Intravenous Q6H  . pantoprazole (PROTONIX) IV  40 mg Intravenous QHS   Continuous Infusions:    Assessment/Plan:  1.  Acute gastroenteritis: Could be related to food poisoning as family member was also sick.  Diarrhea now resolved.  Persistent nausea could be related to diabetic gastroparesis versus gastritis.  Continue IV PPI and IV Reglan every 6 hours.  Monitor QT interval (456 ms today).  She is s/p cholecystectomy.  CT abdomen/pelvis on admission unremarkable.  Will advance to clear liquid diet today, will consider GI evaluation if symptoms recur.  Tums as needed  2.  Mild DKA: Present on admission in the setting of not taking insulin for 3 to 4 days and poor oral intake.  Now transitioned off insulin drip and on Lantus 25 units insulin (takes Toujeo insulin 60 units nightly at baseline).  Oral intake remains poor given above., monitor for hypo/hyper glycemia and adjust insulin accordingly.  Hemoglobin A1c 9.2.  3.  AKI on CKD stage IIIb: Baseline creatinine appears to be around 1.5.   On presentation creatinine elevated to 2.56 and BUN at 50.  AKI likely related to HCTZ use and GI losses.  Creatinine downtrending but continue IV hydration given poor oral intake.  4.  Transaminitis: Probably in the setting of problem #1.  Continue to trend.  Hepatitis profile negative.  5.  Hypertension: Hold HCTZ in the setting of problem #3.  Blood pressure elevated today in the setting of discomfort/nausea.  Added norvasc and hydralazine as needed  6.  Microcytic anemia: Hemoglobin appears stable around 11.  No signs of bleeding  DVT prophylaxis: Heparin Code Status: Full code Family / Patient Communication: Discussed with patient and all questions answered to satisfaction Disposition Plan:   Status is: Observation  The patient will require care spanning > 2 midnights and should be moved to inpatient because: Persistent severe electrolyte disturbances, AKI, unable to tolerate p.o., IV fluids, persistent nausea  Dispo: The patient is from: Home              Anticipated d/c is to: Home              Anticipated d/c date is: 3 days              Patient currently is not medically stable to d/c.          Time spent: 25 minutes     >50% time spent in discussions with care team and coordination of care.    Guilford Shi, MD [facility] Pager in [place]  If 7PM-7AM, please contact night-coverage [email or web address] 03/09/2020, 2:37 PM

## 2020-03-10 LAB — GLUCOSE, CAPILLARY
Glucose-Capillary: 257 mg/dL — ABNORMAL HIGH (ref 70–99)
Glucose-Capillary: 314 mg/dL — ABNORMAL HIGH (ref 70–99)
Glucose-Capillary: 155 mg/dL — ABNORMAL HIGH (ref 70–99)
Glucose-Capillary: 104 mg/dL — ABNORMAL HIGH (ref 70–99)

## 2020-03-10 LAB — BASIC METABOLIC PANEL
Anion gap: 11 (ref 5–15)
BUN: 34 mg/dL — ABNORMAL HIGH (ref 6–20)
CO2: 22 mmol/L (ref 22–32)
Calcium: 9.3 mg/dL (ref 8.9–10.3)
Chloride: 115 mmol/L — ABNORMAL HIGH (ref 98–111)
Creatinine, Ser: 1.96 mg/dL — ABNORMAL HIGH (ref 0.44–1.00)
GFR calc Af Amer: 33 mL/min — ABNORMAL LOW (ref >60)
GFR calc non Af Amer: 28 mL/min — ABNORMAL LOW (ref >60)
Glucose, Bld: 228 mg/dL — ABNORMAL HIGH (ref 70–99)
Potassium: 5.8 mmol/L — ABNORMAL HIGH (ref 3.5–5.1)
Sodium: 148 mmol/L — ABNORMAL HIGH (ref 135–145)

## 2020-03-10 LAB — POTASSIUM: Potassium: 3.7 mmol/L (ref 3.5–5.1)

## 2020-03-10 MED ORDER — ALUM & MAG HYDROXIDE-SIMETH 200-200-20 MG/5ML PO SUSP
15.0000 mL | Freq: Four times a day (QID) | ORAL | Status: DC | PRN
  Filled 2020-03-10: qty 30

## 2020-03-10 MED ORDER — PANTOPRAZOLE SODIUM 40 MG PO TBEC
40.0000 mg | DELAYED_RELEASE_TABLET | Freq: Two times a day (BID) | ORAL | Status: DC
  Administered 2020-03-10 – 2020-03-12 (×5): 40 mg via ORAL
  Filled 2020-03-10 (×5): qty 1

## 2020-03-10 MED ORDER — PANTOPRAZOLE SODIUM 40 MG PO TBEC: 40.0000 mg | DELAYED_RELEASE_TABLET | Freq: Every day | ORAL | Status: DC

## 2020-03-10 MED ORDER — INSULIN GLARGINE 100 UNIT/ML ~~LOC~~ SOLN
18.0000 [IU] | Freq: Every day | SUBCUTANEOUS | Status: DC
  Administered 2020-03-10: 18 [IU] via SUBCUTANEOUS
  Filled 2020-03-10 (×2): qty 0.18

## 2020-03-10 MED ORDER — METOCLOPRAMIDE HCL 10 MG PO TABS: 10.0000 mg | ORAL_TABLET | Freq: Three times a day (TID) | ORAL | Status: DC

## 2020-03-10 MED ORDER — INSULIN GLARGINE 100 UNIT/ML ~~LOC~~ SOLN: 16.0000 [IU] | Freq: Every day | SUBCUTANEOUS | Status: DC

## 2020-03-10 MED ORDER — CARVEDILOL 3.125 MG PO TABS
3.1250 mg | ORAL_TABLET | Freq: Two times a day (BID) | ORAL | Status: DC
  Administered 2020-03-10 – 2020-03-12 (×5): 3.125 mg via ORAL
  Filled 2020-03-10 (×5): qty 1

## 2020-03-10 NOTE — Progress Notes (Signed)
Inpatient Diabetes Program Recommendations  AACE/ADA: New Consensus Statement on Inpatient Glycemic Control (2015)  Target Ranges:  Prepandial:   less than 140 mg/dL      Peak postprandial:   less than 180 mg/dL (1-2 hours)      Critically ill patients:  140 - 180 mg/dL   Lab Results  Component Value Date   GLUCAP 257 (H) 03/10/2020   HGBA1C 9.2 (H) 03/08/2020    Review of Glycemic Control  Current orders for Inpatient glycemic control: Novolog 0-15 units tidwc and hs  Had hypoglycemia yesterday (36 mg/dL) before dinner. Lantus was discontinued.  Blood sugars this am - 228, 257 mg/dL. Will need part of basal insulin added back.  Inpatient Diabetes Program Recommendations:     Add Lantus 20 units QD. Will need meal coverage insulin when po intake improves.  Continue to follow.  Thank you. Lorenda Peck, RD, LDN, CDE Inpatient Diabetes Coordinator [phone]

## 2020-03-10 NOTE — Progress Notes (Signed)
Pharmacy IV to PO conversion  The patient is receiving pantoprazole by the intravenous route.  Based on criteria approved by the Pharmacy and [place], the medication is being converted to the equivalent oral dose form.   No active GI bleeding or impaired absorption  Not s/p esophagectomy  Documented ability to take oral medications for > 24 hr  Plan to continue treatment for at least 1 day  If you have any questions about this conversion, please contact the Pharmacy Department (ext [phone]).  Thank you.  Reuel Boom, PharmD, BCPS [phone] 03/10/2020, 1:31 PM

## 2020-03-10 NOTE — Progress Notes (Addendum)
PROGRESS NOTE    Kirsten Shaffer  WJX:[id number]  DOB: Nov 10, 1965  PCP: System, Pcp Not In Admit date:03/07/2020 Chief compliant: Nausea vomiting, abdominal pain 54 y.o. female with h/o DM on insulin, HTN, CKD3b who ate out on Wednesday with her son.  Both of them got sick.  He got better however her symptoms persisted.  She has had multiple episodes of vomiting, initially emesis of food followed by clear liquid.  Denies any blood in the emesis.  She also had loose stools but not watery.  Denies any blood in the stool.  She also mentioned diffuse abdominal pain but more in the upper abdomen.  No fever or chills.  She last took her insulin on Wednesday or Thursday.  She was scared of her glucose levels dropping too much and so she did not take her insulin the last few days. She decided to come into the emergency department due to persistent symptoms. ED Course:In the emergency department she was found to have mild DKA with an anion gap of 18 and a glucose level of 462.  She was also found to have AKI, transaminitis.  She is status post cholecystectomy previously.   Hospital course: Patient admitted to [facility] for further management.  Subjective:  Patient reports significant improvement in nausea, wants to try solid food.  She did have 1-2 loose bowel movements earlier today but no abdominal pain.  Required 1 dose of Compazine earlier.  States Reglan making her symptoms worse.  Blood pressure overall improved but still elevated with systolic 213-086V.  Objective: Vitals:   03/09/20 1645 03/09/20 2109 03/10/20 0633 03/10/20 1245  BP: (!) 167/72 (!) 146/58 (!) 166/57 (!) 150/62  Pulse:  (!) 107 (!) 105 91  Resp:  20 16 14   Temp:  99.1 F (37.3 C) 98.6 F (37 C) 98.5 F (36.9 C)  TempSrc:  Oral Oral Oral  SpO2:  93% 95% 97%  Weight:   115.3 kg   Height:       No intake or output data in the 24 hours ending 03/10/20 1444 Filed Weights   03/07/20 1306 03/10/20 7846  Weight: 117.7 kg 115.3 kg      Physical Examination:  General: Moderately built, no acute distress noted Head ENT: Atraumatic normocephalic, PERRLA, neck supple Heart: S1-S2 heard, regular rate and rhythm, no murmurs.  No leg edema noted Lungs: Equal air entry bilaterally, no rhonchi or rales on exam, no accessory muscle use Abdomen: Bowel sounds heard, soft, mild epigastric tenderness, nondistended. No organomegaly.  No CVA tenderness Extremities: No pedal edema.  No cyanosis or clubbing. Neurological: Awake alert oriented x3, no focal weakness or numbness, strength and sensations to crude touch intact Skin: No wounds or rashes.  Data Reviewed: I have personally reviewed following labs and imaging studies  CBC: Recent Labs  Lab 03/05/20 0018 03/07/20 0807 03/07/20 0839 03/08/20 0539  WBC 10.4 15.1*  --  11.6*  NEUTROABS  --  12.5*  --   --   HGB 10.2* 11.7* 11.9* 11.0*  HCT 32.3* 36.7 35.0* 35.9*  MCV 78.2* 77.8*  --  82.5  PLT 250 313  --  962   Basic Metabolic Panel: Recent Labs  Lab 03/05/20 0018 03/05/20 0018 03/07/20 0807 03/07/20 0807 03/07/20 0839 03/07/20 1230 03/08/20 0539 03/10/20 0535 03/10/20 1017  NA 137   < > 138  --  140 141 147* 148*  --   K 4.9   < > 4.2   < > 4.1 4.0  3.8 5.8* 3.7  CL 105  --  100  --   --  107 114* 115*  --   CO2 19*  --  20*  --   --  22 23 22   --   GLUCOSE 232*  --  462*  --   --  390* 143* 228*  --   BUN 23*  --  50*  --   --  45* 44* 34*  --   CREATININE 1.46*  --  2.56*  --   --  2.36* 2.16* 1.96*  --   CALCIUM 9.6  --  9.7  --   --  8.6* 9.3 9.3  --    < > = values in this interval not displayed.   GFR: Estimated Creatinine Clearance: 43 mL/min (A) (by C-G formula based on SCr of 1.96 mg/dL (H)). Liver Function Tests: Recent Labs  Lab 03/07/20 0807 03/08/20 0539  AST 89* 34  ALT 234* 149*  ALKPHOS 324* 243*  BILITOT 0.9 0.8  PROT 8.7* 7.9  ALBUMIN 4.0 3.8   Recent Labs  Lab 03/07/20 0807  LIPASE 31   No results for input(s): AMMONIA  in the last 168 hours. Coagulation Profile: No results for input(s): INR, PROTIME in the last 168 hours. Cardiac Enzymes: No results for input(s): CKTOTAL, CKMB, CKMBINDEX, TROPONINI in the last 168 hours. BNP (last 3 results) No results for input(s): PROBNP in the last 8760 hours. HbA1C: Recent Labs    03/08/20 0509  HGBA1C 9.2*   CBG: Recent Labs  Lab 03/09/20 1647 03/09/20 1707 03/09/20 2106 03/10/20 0752 03/10/20 1206  GLUCAP 36* 170* 175* 257* 314*   Lipid Profile: No results for input(s): CHOL, HDL, LDLCALC, TRIG, CHOLHDL, LDLDIRECT in the last 72 hours. Thyroid Function Tests: No results for input(s): TSH, T4TOTAL, FREET4, T3FREE, THYROIDAB in the last 72 hours. Anemia Panel: No results for input(s): VITAMINB12, FOLATE, FERRITIN, TIBC, IRON, RETICCTPCT in the last 72 hours. Sepsis Labs: No results for input(s): PROCALCITON, LATICACIDVEN in the last 168 hours.  Recent Results (from the past 240 hour(s))  SARS Coronavirus 2 by RT PCR (hospital order, performed in [facility] hospital lab) Nasopharyngeal Nasopharyngeal Swab     Status: None   Collection Time: 03/07/20  8:51 AM   Specimen: Nasopharyngeal Swab  Result Value Ref Range Status   SARS Coronavirus 2 NEGATIVE NEGATIVE Final    Comment: (NOTE) SARS-CoV-2 target nucleic acids are NOT DETECTED.  The SARS-CoV-2 RNA is generally detectable in upper and lower respiratory specimens during the acute phase of infection. The lowest concentration of SARS-CoV-2 viral copies this assay can detect is 250 copies / mL. A negative result does not preclude SARS-CoV-2 infection and should not be used as the sole basis for treatment or other patient management decisions.  A negative result may occur with improper specimen collection / handling, submission of specimen other than nasopharyngeal swab, presence of viral mutation(s) within the areas targeted by this assay, and inadequate number of viral copies (<250 copies /  mL). A negative result must be combined with clinical observations, patient history, and epidemiological information.  Fact Sheet for Patients:   StrictlyIdeas.no  Fact Sheet for Healthcare Providers: BankingDealers.co.za  This test is not yet approved or  cleared by the [place] FDA and has been authorized for detection and/or diagnosis of SARS-CoV-2 by FDA under an Emergency Use Authorization (EUA).  This EUA will remain in effect (meaning this test can be used) for the duration of the  COVID-19 declaration under Section 564(b)(1) of the Act, 21 U.S.C. section 360bbb-3(b)(1), unless the authorization is terminated or revoked sooner.  Performed at [facility], [address], [place], [place] [address]       Radiology Studies: No results found.    Scheduled Meds: . (feeding supplement) PROSource Plus  30 mL Oral BID BM  . amLODipine  10 mg Oral Daily  . calcium carbonate  1 tablet Oral BID  . carvedilol  3.125 mg Oral BID WC  . feeding supplement  1 Container Oral TID BM  . heparin  5,000 Units Subcutaneous Q8H  . insulin aspart  0-15 Units Subcutaneous TID WC  . insulin aspart  0-5 Units Subcutaneous QHS  . insulin glargine  18 Units Subcutaneous Daily  . metoCLOPramide  10 mg Oral TID AC  . pantoprazole  40 mg Oral QHS   Continuous Infusions:    Assessment/Plan:  1.  Acute gastroenteritis: Could be related to food poisoning as family member was also sick.  Diarrhea now resolved.  Persistent nausea could be related to diabetic gastroparesis versus gastritis.  Continue PPI and changed Reglan to p.o. but patient wants to try coming off Reglan as she feels her symptoms are worse when she takes the medication.  She is s/p cholecystectomy.  CT abdomen/pelvis on admission unremarkable.  Will advance to soft diet today.  Continue Tums as needed.  If does well , possibly discharge in a.m.  2.  Mild DKA: Present on  admission in the setting of not taking insulin for 3 to 4 days and poor oral intake.  Transitioned off insulin drip to Lantus 25 units insulin (takes Toujeo insulin 60 units nightly at baseline) but developed hypoglycemia with blood glucose in 30s yesterday afternoon.  Will monitor with Lantus lower dose 18 to 20 units and adjust per oral intake.Marland Kitchen   Hemoglobin A1c 9.2.  3.  AKI on CKD stage IIIb: Baseline creatinine appears to be around 1.5.  On presentation creatinine elevated to 2.56 and BUN at 50.  AKI likely related to HCTZ use and GI losses.  Creatinine downtrending but continue IV hydration given poor oral intake.  4.  Transaminitis: Probably in the setting of problem #1.  Downtrending, continue to trend-labs in a.m.  Hepatitis profile negative.  5.  Hypertension: Hold HCTZ in the setting of problem #3.  Blood pressure elevated in the setting of discomfort/nausea.  Added/Titrated norvasc and hydralazine as needed. BP slowly improving, added low dose coreg  6.  Microcytic anemia: Hemoglobin appears stable around 11.  No signs of bleeding  DVT prophylaxis: Heparin Code Status: Full code Family / Patient Communication: Discussed with patient and all questions answered to satisfaction Disposition Plan:   Status is: Observation  The patient will require care spanning > 2 midnights and should be moved to inpatient because: Persistent severe electrolyte disturbances, AKI, unable to tolerate p.o., IV fluids, persistent nausea  Dispo: The patient is from: Home              Anticipated d/c is to: Home              Anticipated d/c date is: 1 days              Patient currently is not medically stable to d/c.          Time spent: 25 minutes     >50% time spent in discussions with care team and coordination of care.    Eames Dibiasio  Earnest Conroy, MD [facility] Pager in [place]  If 7PM-7AM, please contact night-coverage [email or web address] 03/10/2020, 2:44 PM

## 2020-03-11 ENCOUNTER — Inpatient Hospital Stay (HOSPITAL_COMMUNITY): Payer: Medicare Other

## 2020-03-11 LAB — BASIC METABOLIC PANEL
Anion gap: 8 (ref 5–15)
BUN: 32 mg/dL — ABNORMAL HIGH (ref 6–20)
CO2: 26 mmol/L (ref 22–32)
Calcium: 9.3 mg/dL (ref 8.9–10.3)
Chloride: 112 mmol/L — ABNORMAL HIGH (ref 98–111)
Creatinine, Ser: 2.02 mg/dL — ABNORMAL HIGH (ref 0.44–1.00)
GFR calc Af Amer: 32 mL/min — ABNORMAL LOW (ref >60)
GFR calc non Af Amer: 27 mL/min — ABNORMAL LOW (ref >60)
Glucose, Bld: 113 mg/dL — ABNORMAL HIGH (ref 70–99)
Potassium: 3.6 mmol/L (ref 3.5–5.1)
Sodium: 146 mmol/L — ABNORMAL HIGH (ref 135–145)

## 2020-03-11 LAB — GLUCOSE, CAPILLARY
Glucose-Capillary: 134 mg/dL — ABNORMAL HIGH (ref 70–99)
Glucose-Capillary: 143 mg/dL — ABNORMAL HIGH (ref 70–99)
Glucose-Capillary: 189 mg/dL — ABNORMAL HIGH (ref 70–99)
Glucose-Capillary: 232 mg/dL — ABNORMAL HIGH (ref 70–99)
Glucose-Capillary: 305 mg/dL — ABNORMAL HIGH (ref 70–99)

## 2020-03-11 MED ORDER — SODIUM CHLORIDE 0.9 % IV SOLN: INTRAVENOUS | Status: DC

## 2020-03-11 MED ORDER — INSULIN GLARGINE 100 UNIT/ML ~~LOC~~ SOLN
20.0000 [IU] | Freq: Every day | SUBCUTANEOUS | Status: DC
  Administered 2020-03-11: 20 [IU] via SUBCUTANEOUS
  Filled 2020-03-11 (×2): qty 0.2

## 2020-03-11 MED ORDER — SODIUM CHLORIDE 0.45 % IV SOLN: INTRAVENOUS | Status: DC

## 2020-03-11 NOTE — Care Management Important Message (Signed)
Important Message  Patient Details IM Letter given to the Patient Name: KANAI HILGER MRN: [id number] Date of Birth: [birth year]   Medicare Important Message Given:  Yes     Kerin Salen 03/11/2020, 10:59 AM

## 2020-03-11 NOTE — Progress Notes (Signed)
PROGRESS NOTE    Kirsten Shaffer  VZD:[id number]  DOB: 30-Jun-1966  PCP: System, Pcp Not In Admit date:03/07/2020 Chief compliant: Nausea vomiting, abdominal pain 54 y.o. female with h/o DM on insulin, HTN, CKD3b who ate out on Wednesday with her son.  Both of them got sick.  He got better however her symptoms persisted.  She has had multiple episodes of vomiting, initially emesis of food followed by clear liquid.  Denies any blood in the emesis.  She also had loose stools but not watery.  Denies any blood in the stool.  She also mentioned diffuse abdominal pain but more in the upper abdomen.  No fever or chills.  She last took her insulin on Wednesday or Thursday.  She was scared of her glucose levels dropping too much and so she did not take her insulin the last few days. She decided to come into the emergency department due to persistent symptoms. ED Course:In the emergency department she was found to have mild DKA with an anion gap of 18 and a glucose level of 462.  She was also found to have AKI, transaminitis.  She is status post cholecystectomy previously.   Hospital course: Patient admitted to [facility] for further management.  Subjective:  Patient reports improvement in nausea after Reglan discontinued.  Tolerating solid diet without vomiting but reports loss of appetite.  She did have 1-2 loose bowel movements earlier today but no abdominal pain. Blood pressure overall improved but still elevated with systolic 295-188C.  Objective: Vitals:   03/10/20 2052 03/11/20 0554 03/11/20 0839 03/11/20 1210  BP: (!) 104/43 (!) 153/52 (!) 148/72 (!) 145/66  Pulse: 86 93 86 88  Resp: 16 19  14   Temp: 98.6 F (37 C) 97.7 F (36.5 C)  99.3 F (37.4 C)  TempSrc: Oral Oral  Oral  SpO2: 97% 99%  98%  Weight:      Height:        Intake/Output Summary (Last 24 hours) at 03/11/2020 1441 Last data filed at 03/11/2020 1400 Gross per 24 hour  Intake 852.08 ml  Output --  Net 852.08 ml   Filed  Weights   03/07/20 1306 03/10/20 0633  Weight: 117.7 kg 115.3 kg    Physical Examination:  General: Moderately built, no acute distress noted Head ENT: Atraumatic normocephalic, PERRLA, neck supple Heart: S1-S2 heard, regular rate and rhythm, no murmurs.  No leg edema noted Lungs: Equal air entry bilaterally, no rhonchi or rales on exam, no accessory muscle use Abdomen: Bowel sounds heard, soft, mild epigastric tenderness, nondistended. No organomegaly.  No CVA tenderness Extremities: No pedal edema.  No cyanosis or clubbing. Neurological: Awake alert oriented x3, no focal weakness or numbness, strength and sensations to crude touch intact Skin: No wounds or rashes.  Data Reviewed: I have personally reviewed following labs and imaging studies  CBC: Recent Labs  Lab 03/05/20 0018 03/07/20 0807 03/07/20 0839 03/08/20 0539  WBC 10.4 15.1*  --  11.6*  NEUTROABS  --  12.5*  --   --   HGB 10.2* 11.7* 11.9* 11.0*  HCT 32.3* 36.7 35.0* 35.9*  MCV 78.2* 77.8*  --  82.5  PLT 250 313  --  166   Basic Metabolic Panel: Recent Labs  Lab 03/07/20 0807 03/07/20 0807 03/07/20 0839 03/07/20 0839 03/07/20 1230 03/08/20 0539 03/10/20 0535 03/10/20 1017 03/11/20 0520  NA 138   < > 140  --  141 147* 148*  --  146*  K 4.2   < >  4.1   < > 4.0 3.8 5.8* 3.7 3.6  CL 100  --   --   --  107 114* 115*  --  112*  CO2 20*  --   --   --  22 23 22   --  26  GLUCOSE 462*  --   --   --  390* 143* 228*  --  113*  BUN 50*  --   --   --  45* 44* 34*  --  32*  CREATININE 2.56*  --   --   --  2.36* 2.16* 1.96*  --  2.02*  CALCIUM 9.7  --   --   --  8.6* 9.3 9.3  --  9.3   < > = values in this interval not displayed.   GFR: Estimated Creatinine Clearance: 41.8 mL/min (A) (by C-G formula based on SCr of 2.02 mg/dL (H)). Liver Function Tests: Recent Labs  Lab 03/07/20 0807 03/08/20 0539  AST 89* 34  ALT 234* 149*  ALKPHOS 324* 243*  BILITOT 0.9 0.8  PROT 8.7* 7.9  ALBUMIN 4.0 3.8   Recent Labs   Lab 03/07/20 0807  LIPASE 31   No results for input(s): AMMONIA in the last 168 hours. Coagulation Profile: No results for input(s): INR, PROTIME in the last 168 hours. Cardiac Enzymes: No results for input(s): CKTOTAL, CKMB, CKMBINDEX, TROPONINI in the last 168 hours. BNP (last 3 results) No results for input(s): PROBNP in the last 8760 hours. HbA1C: No results for input(s): HGBA1C in the last 72 hours. CBG: Recent Labs  Lab 03/10/20 1206 03/10/20 1610 03/10/20 2051 03/11/20 0734 03/11/20 1204  GLUCAP 314* 155* 104* 189* 305*   Lipid Profile: No results for input(s): CHOL, HDL, LDLCALC, TRIG, CHOLHDL, LDLDIRECT in the last 72 hours. Thyroid Function Tests: No results for input(s): TSH, T4TOTAL, FREET4, T3FREE, THYROIDAB in the last 72 hours. Anemia Panel: No results for input(s): VITAMINB12, FOLATE, FERRITIN, TIBC, IRON, RETICCTPCT in the last 72 hours. Sepsis Labs: No results for input(s): PROCALCITON, LATICACIDVEN in the last 168 hours.  Recent Results (from the past 240 hour(s))  SARS Coronavirus 2 by RT PCR (hospital order, performed in [facility] hospital lab) Nasopharyngeal Nasopharyngeal Swab     Status: None   Collection Time: 03/07/20  8:51 AM   Specimen: Nasopharyngeal Swab  Result Value Ref Range Status   SARS Coronavirus 2 NEGATIVE NEGATIVE Final    Comment: (NOTE) SARS-CoV-2 target nucleic acids are NOT DETECTED.  The SARS-CoV-2 RNA is generally detectable in upper and lower respiratory specimens during the acute phase of infection. The lowest concentration of SARS-CoV-2 viral copies this assay can detect is 250 copies / mL. A negative result does not preclude SARS-CoV-2 infection and should not be used as the sole basis for treatment or other patient management decisions.  A negative result may occur with improper specimen collection / handling, submission of specimen other than nasopharyngeal swab, presence of viral mutation(s) within the areas  targeted by this assay, and inadequate number of viral copies (<250 copies / mL). A negative result must be combined with clinical observations, patient history, and epidemiological information.  Fact Sheet for Patients:   StrictlyIdeas.no  Fact Sheet for Healthcare Providers: BankingDealers.co.za  This test is not yet approved or  cleared by the [place] FDA and has been authorized for detection and/or diagnosis of SARS-CoV-2 by FDA under an Emergency Use Authorization (EUA).  This EUA will remain in effect (meaning this test can be used)  for the duration of the COVID-19 declaration under Section 564(b)(1) of the Act, 21 U.S.C. section 360bbb-3(b)(1), unless the authorization is terminated or revoked sooner.  Performed at [facility], [address] [place], [place] [address]       Radiology Studies: No results found.    Scheduled Meds: . (feeding supplement) PROSource Plus  30 mL Oral BID BM  . amLODipine  10 mg Oral Daily  . calcium carbonate  1 tablet Oral BID  . carvedilol  3.125 mg Oral BID WC  . feeding supplement  1 Container Oral TID BM  . heparin  5,000 Units Subcutaneous Q8H  . insulin aspart  0-15 Units Subcutaneous TID WC  . insulin aspart  0-5 Units Subcutaneous QHS  . insulin glargine  20 Units Subcutaneous Daily  . pantoprazole  40 mg Oral BID   Continuous Infusions: . sodium chloride 125 mL/hr at 03/11/20 1159      Assessment/Plan:  1.  Acute gastroenteritis: Could be related to food poisoning as family member was also sick.  Diarrhea now resolved.  Persistent nausea could be related to diabetic gastroparesis versus gastritis.  Continue PPI and changed Reglan to p.o. but patient wants to try coming off Reglan as she feels her symptoms are worse when she takes the medication.  She is s/p cholecystectomy.  CT abdomen/pelvis on admission unremarkable.  Will advance to soft diet today.   Continue Tums as needed.  If does well , possibly discharge in a.m.  2.  Mild DKA: Present on admission in the setting of not taking insulin for 3 to 4 days and poor oral intake.  Transitioned off insulin drip to Lantus 25 units insulin (takes Toujeo insulin 60 units nightly at baseline) but developed hypoglycemia with blood glucose in 30s yesterday afternoon.  Will monitor with Lantus lower dose 18 to 20 units and adjust per oral intake.Marland Kitchen   Hemoglobin A1c 9.2.  3.  AKI on CKD stage IIIb with mild hypernatremia: Baseline creatinine appears to be around 1.5.  On presentation creatinine elevated to 2.56 and BUN at 50.  AKI likely related to HCTZ use and GI losses.  Creatinine downtrended to 1.9 with IV fluids but back up to 2 now with hypernatremia.  Will obtain renal ultrasound and resume IV fluids  4.  Transaminitis: Probably in the setting of problem #1.  Downtrending, continue to trend-labs in a.m.  Hepatitis profile negative.  5.  Hypertension: Hold HCTZ in the setting of problem #3.  Blood pressure elevated in the setting of discomfort/nausea.  Added/Titrated norvasc and hydralazine as needed. BP slowly improving, added low dose coreg-SBP fluctuating 108-150s.  Monitor for now  6.  Microcytic anemia: Hemoglobin appears stable around 11.  No signs of bleeding  DVT prophylaxis: Heparin Code Status: Full code Family / Patient Communication: Discussed with patient and all questions answered to satisfaction Disposition Plan:   Status is: Inpatient    Dispo: The patient is from: Home              Anticipated d/c is to: Home              Anticipated d/c date is: Possibly in a.m. if renal function improves and hypernatremia resolves, tolerating diet and can come off IV fluids              Patient currently is not medically stable to d/c.          Time spent: 25 minutes     >  50% time spent in discussions with care team and coordination of care.    Guilford Shi, MD [facility] Pager in [place]  If 7PM-7AM, please contact night-coverage [email or web address] 03/11/2020, 2:41 PM

## 2020-03-12 DIAGNOSIS — R1013 Epigastric pain: Secondary | ICD-10-CM

## 2020-03-12 DIAGNOSIS — R739 Hyperglycemia, unspecified: Secondary | ICD-10-CM

## 2020-03-12 LAB — BASIC METABOLIC PANEL
Anion gap: 13 (ref 5–15)
BUN: 28 mg/dL — ABNORMAL HIGH (ref 6–20)
CO2: 19 mmol/L — ABNORMAL LOW (ref 22–32)
Calcium: 8.7 mg/dL — ABNORMAL LOW (ref 8.9–10.3)
Chloride: 107 mmol/L (ref 98–111)
Creatinine, Ser: 1.65 mg/dL — ABNORMAL HIGH (ref 0.44–1.00)
GFR calc Af Amer: 40 mL/min — ABNORMAL LOW (ref >60)
GFR calc non Af Amer: 35 mL/min — ABNORMAL LOW (ref >60)
Glucose, Bld: 233 mg/dL — ABNORMAL HIGH (ref 70–99)
Potassium: 4.6 mmol/L (ref 3.5–5.1)
Sodium: 139 mmol/L (ref 135–145)

## 2020-03-12 LAB — GLUCOSE, CAPILLARY
Glucose-Capillary: 203 mg/dL — ABNORMAL HIGH (ref 70–99)
Glucose-Capillary: 285 mg/dL — ABNORMAL HIGH (ref 70–99)

## 2020-03-12 MED ORDER — AMLODIPINE BESYLATE 10 MG PO TABS: 10.0000 mg | ORAL_TABLET | Freq: Every day | ORAL | 2 refills | Status: DC

## 2020-03-12 MED ORDER — ONDANSETRON HCL 4 MG PO TABS: 4.0000 mg | ORAL_TABLET | Freq: Four times a day (QID) | ORAL | 0 refills | Status: DC | PRN

## 2020-03-12 MED ORDER — CARVEDILOL 3.125 MG PO TABS: 3.1250 mg | ORAL_TABLET | Freq: Two times a day (BID) | ORAL | 1 refills | Status: DC

## 2020-03-12 MED ORDER — PROSOURCE PLUS PO LIQD: 30.0000 mL | Freq: Two times a day (BID) | ORAL | Status: DC

## 2020-03-12 NOTE — Discharge Summary (Signed)
Physician Discharge Summary  Kirsten Shaffer YTK:[id number] DOB: [birth year] DOA: 03/07/2020  PCP: System, Pcp Not In  Admit date: 03/07/2020 Discharge date: 03/12/2020 Consultations: Diabetes educator Admitted From: home  Disposition: home  Discharge Diagnoses:  Principal Problem:   Intractable nausea and vomiting Active Problems:   DKA (diabetic ketoacidoses) ([place])   CKD (chronic kidney disease), stage III   DM (diabetes mellitus), type 2 ([place])   Essential hypertension   AKI (acute kidney injury) ([place])   Transaminitis   Hospital Course Summary: 54 y.o.femalewith h/o DM on insulin, HTN,CKD3bwho ate out on Wednesday with her son. Both of them got sick. He got better however her symptoms persisted. She has had multiple episodes of vomiting,initially emesis offood followed by clear liquid. Denies any blood in the emesis. She also had loose stools but not watery. Denies any blood in the stool. She also mentioneddiffuse abdominal pain but more in the upper abdomen. No fever or chills. She last took her insulin on Wednesday or Thursday. She was scared of her glucose levels dropping too much and so she did not take her insulin the last few days. She decided to come into the emergency department due to persistent symptoms. ED Course:In the emergency department she was found to have mild DKA with an anion gap of 18 and a glucose level of 462. She was also found to have AKI, transaminitis. She is status post cholecystectomy previously.  Hospital course: Patient admitted to [facility] for further management.  1.  Acute gastroenteritis: Could be related to food poisoning as family member was also sick.  Diarrhea now resolved.  Persistent nausea suspected to be possibly related to diabetic gastroparesis versus gastritis.  She was initiated on IV PPI and Reglan but patient did not tolerate Reglan making her symptoms worse.  She however did improve with IV PPI. She is s/p cholecystectomy.  CT  abdomen/pelvis on admission unremarkable.  Diet was slowly advanced.   She is tolerating regular diet well now and feels ready for discharge.  No further diarrhea.  2.  Mild DKA: Present on admission in the setting of not taking insulin for 3 to 4 days and poor oral intake.  Transitioned off insulin drip to Lantus 25 units insulin (takes Toujeo insulin 60 units nightly at baseline) but developed hypoglycemia with blood glucose in 30s 8/11 afternoon.  Reduced Lantus to 20 units before diet advancement. Patient now tolerating regular diet, can resume home insulin regimen (although advised to monitor blood glucose closely and adjust per oral intake as patient noted to be on multiple insulin treatments-Toujeo, Trulicity, NPH) she is likely diet noncompliant at baseline.  Hemoglobin A1c 9.2.  3.  AKI on CKD stage IIIb with mild hypernatremia: Baseline creatinine appears to be around 1.5.  On presentation creatinine elevated to 2.56 and BUN at 50.  AKI likely related to HCTZ use and GI losses.  Creatinine downtrended to 1.9 with IV fluids but back up to 2.0 yesterday, with hypernatremia. Resumed hypotonic IV fluids and both sodium and renal function improved today.  4.  Transaminitis: Probably in the setting of problem #1.  Downtrending/  Hepatitis profile negative.  5.  Hypertension: Held HCTZ/Losartan in the setting of problem #3.  Blood pressure elevated in the setting of discomfort/nausea.  Added/Titrated norvasc and hydralazine as needed. BP slowly improved, added low dose coreg. Normotensive prior to discharge. Recommend f/u with PCP before resuming ACEI (given h/o DM). Recommend repeat BMP in 3 days.  6.  Microcytic anemia: Hemoglobin appears  stable around 11.  No signs of bleeding  Discharge Exam:   Vitals:   03/11/20 1210 03/11/20 1703 03/11/20 2042 03/12/20 0502  BP: (!) 145/66 (!) 149/70 (!) 130/58 137/62  Pulse: 88 85 83 78  Resp: 14  18 16   Temp: 99.3 F (37.4 C)  98.9 F (37.2 C)  97.8 F (36.6 C)  TempSrc: Oral  Oral Oral  SpO2: 98%  99% 97%  Weight:      Height:        General: Pt is alert, awake, not in acute distress Cardiovascular: RRR, S1/S2 +, no rubs, no gallops Respiratory: CTA bilaterally, no wheezing, no rhonchi Abdominal: Soft, NT, ND, bowel sounds + Extremities: no edema, no cyanosis  Discharge Condition:Stable CODE STATUS: Full code Diet recommendation: Avoid gastric irritants, low-salt diabetic diet Recommendations for Outpatient Follow-up:  1. Follow up with PCP: 5 days 2. Follow up with consultants: Primary endocrinologist in 1 to 2 weeks 3. Please obtain follow up labs including: [place] services upon discharge: None Equipment/Devices upon discharge: None   Discharge Instructions:  Discharge Instructions    Call MD for:  difficulty breathing, headache or visual disturbances   Complete by: As directed    Call MD for:  persistant dizziness or light-headedness   Complete by: As directed    Call MD for:  persistant nausea and vomiting   Complete by: As directed    Call MD for:  severe uncontrolled pain   Complete by: As directed    Call MD for:  temperature >100.4   Complete by: As directed    Diet - low sodium heart healthy   Complete by: As directed    Increase activity slowly   Complete by: As directed      Allergies as of 03/12/2020      Reactions   Metformin And Related Nausea Only      Medication List    STOP taking these medications   hydrochlorothiazide 12.5 MG tablet Commonly known as: HYDRODIURIL   HYDROcodone-acetaminophen 5-325 MG tablet Commonly known as: NORCO/VICODIN   losartan 100 MG tablet Commonly known as: COZAAR   potassium chloride 10 MEQ tablet Commonly known as: KLOR-CON     TAKE these medications   (feeding supplement) PROSource Plus liquid Take 30 mLs by mouth 2 (two) times daily between meals.   albuterol 108 (90 Base) MCG/ACT inhaler Commonly known as: VENTOLIN HFA Inhale  1-2 puffs into the lungs every 6 (six) hours as needed for wheezing or shortness of breath. What changed: Another medication with the same name was removed. Continue taking this medication, and follow the directions you see here.   amLODipine 10 MG tablet Commonly known as: NORVASC Take 1 tablet (10 mg total) by mouth daily.   blood glucose meter kit and supplies Kit Dispense based on patient and insurance preference. Use up to four times daily as directed. (FOR ICD-9 250.00, 250.01).   carvedilol 3.125 MG tablet Commonly known as: COREG Take 1 tablet (3.125 mg total) by mouth 2 (two) times daily with a meal.   insulin NPH Human 100 UNIT/ML injection Commonly known as: NOVOLIN N Inject 2-10 Units into the skin 2 (two) times daily as needed (high blood sugar). 351-400    10 units      151-200    2 units   ondansetron 4 MG tablet Commonly known as: ZOFRAN Take 1 tablet (4 mg total) by mouth every 6 (six) hours as needed for nausea.  ondansetron 8 MG disintegrating tablet Commonly known as: ZOFRAN-ODT Take 8 mg by mouth 3 (three) times daily as needed for nausea/vomiting.   pantoprazole 40 MG tablet Commonly known as: PROTONIX Take 40 mg by mouth every evening.   promethazine 25 MG tablet Commonly known as: PHENERGAN Take 25 mg by mouth 2 (two) times daily as needed for nausea/vomiting.   sertraline 50 MG tablet Commonly known as: Zoloft Take 1 tablet (50 mg total) by mouth daily. What changed: when to take this   simvastatin 40 MG tablet Commonly known as: ZOCOR Take 40 mg by mouth at bedtime.   Toujeo Max SoloStar 300 UNIT/ML Solostar Pen Generic drug: insulin glargine (2 Unit Dial) Inject 60 Units into the skin at bedtime.   Trulicity 1.61 WR/6.0AV Sopn Generic drug: Dulaglutide Inject 0.5 mLs into the skin once a week. Mondays   VITAMIN D PO Take 1 capsule by mouth daily.       Allergies  Allergen Reactions  . Metformin And Related Nausea Only       The results of significant diagnostics from this hospitalization (including imaging, microbiology, ancillary and laboratory) are listed below for reference.    Labs: BNP (last 3 results) No results for input(s): BNP in the last 8760 hours. Basic Metabolic Panel: Recent Labs  Lab 03/07/20 1230 03/07/20 1230 03/08/20 0539 03/10/20 0535 03/10/20 1017 03/11/20 0520 03/12/20 0528  NA 141  --  147* 148*  --  146* 139  K 4.0   < > 3.8 5.8* 3.7 3.6 4.6  CL 107  --  114* 115*  --  112* 107  CO2 22  --  23 22  --  26 19*  GLUCOSE 390*  --  143* 228*  --  113* 233*  BUN 45*  --  44* 34*  --  32* 28*  CREATININE 2.36*  --  2.16* 1.96*  --  2.02* 1.65*  CALCIUM 8.6*  --  9.3 9.3  --  9.3 8.7*   < > = values in this interval not displayed.   Liver Function Tests: Recent Labs  Lab 03/07/20 0807 03/08/20 0539  AST 89* 34  ALT 234* 149*  ALKPHOS 324* 243*  BILITOT 0.9 0.8  PROT 8.7* 7.9  ALBUMIN 4.0 3.8   Recent Labs  Lab 03/07/20 0807  LIPASE 31   No results for input(s): AMMONIA in the last 168 hours. CBC: Recent Labs  Lab 03/07/20 0807 03/07/20 0839 03/08/20 0539  WBC 15.1*  --  11.6*  NEUTROABS 12.5*  --   --   HGB 11.7* 11.9* 11.0*  HCT 36.7 35.0* 35.9*  MCV 77.8*  --  82.5  PLT 313  --  283   Cardiac Enzymes: No results for input(s): CKTOTAL, CKMB, CKMBINDEX, TROPONINI in the last 168 hours. BNP: Invalid input(s): POCBNP CBG: Recent Labs  Lab 03/11/20 0734 03/11/20 1204 03/11/20 1635 03/11/20 2043 03/11/20 2356  GLUCAP 189* 305* 232* 143* 134*   D-Dimer No results for input(s): DDIMER in the last 72 hours. Hgb A1c No results for input(s): HGBA1C in the last 72 hours. Lipid Profile No results for input(s): CHOL, HDL, LDLCALC, TRIG, CHOLHDL, LDLDIRECT in the last 72 hours. Thyroid function studies No results for input(s): TSH, T4TOTAL, T3FREE, THYROIDAB in the last 72 hours.  Invalid input(s): FREET3 Anemia work up No results for input(s):  VITAMINB12, FOLATE, FERRITIN, TIBC, IRON, RETICCTPCT in the last 72 hours. Urinalysis    Component Value Date/Time   COLORURINE YELLOW 03/07/2020  [place] 03/07/2020 1025   LABSPEC 1.020 03/07/2020 1025   PHURINE 5.5 03/07/2020 1025   GLUCOSEU >=500 (A) 03/07/2020 1025   HGBUR SMALL (A) 03/07/2020 1025   BILIRUBINUR NEGATIVE 03/07/2020 1025   KETONESUR 15 (A) 03/07/2020 1025   PROTEINUR 30 (A) 03/07/2020 1025   UROBILINOGEN 0.2 11/09/2013 2119   NITRITE NEGATIVE 03/07/2020 1025   LEUKOCYTESUR NEGATIVE 03/07/2020 1025   Sepsis Labs Invalid input(s): PROCALCITONIN,  WBC,  LACTICIDVEN Microbiology Recent Results (from the past 240 hour(s))  SARS Coronavirus 2 by RT PCR (hospital order, performed in [place] hospital lab) Nasopharyngeal Nasopharyngeal Swab     Status: None   Collection Time: 03/07/20  8:51 AM   Specimen: Nasopharyngeal Swab  Result Value Ref Range Status   SARS Coronavirus 2 NEGATIVE NEGATIVE Final    Comment: (NOTE) SARS-CoV-2 target nucleic acids are NOT DETECTED.  The SARS-CoV-2 RNA is generally detectable in upper and lower respiratory specimens during the acute phase of infection. The lowest concentration of SARS-CoV-2 viral copies this assay can detect is 250 copies / mL. A negative result does not preclude SARS-CoV-2 infection and should not be used as the sole basis for treatment or other patient management decisions.  A negative result may occur with improper specimen collection / handling, submission of specimen other than nasopharyngeal swab, presence of viral mutation(s) within the areas targeted by this assay, and inadequate number of viral copies (<250 copies / mL). A negative result must be combined with clinical observations, patient history, and epidemiological information.  Fact Sheet for Patients:   StrictlyIdeas.no  Fact Sheet for Healthcare  Providers: BankingDealers.co.za  This test is not yet approved or  cleared by the [place] FDA and has been authorized for detection and/or diagnosis of SARS-CoV-2 by FDA under an Emergency Use Authorization (EUA).  This EUA will remain in effect (meaning this test can be used) for the duration of the COVID-19 declaration under Section 564(b)(1) of the Act, 21 U.S.C. section 360bbb-3(b)(1), unless the authorization is terminated or revoked sooner.  Performed at [facility], [address] [place], [place] [address]     Procedures/Studies: CT Abdomen Pelvis Wo Contrast  Result Date: 03/07/2020 CLINICAL DATA:  Bowel obstruction suspected EXAM: CT ABDOMEN AND PELVIS WITHOUT CONTRAST TECHNIQUE: Multidetector CT imaging of the abdomen and pelvis was performed following the standard protocol without IV contrast. COMPARISON:  11/10/2013 FINDINGS: Lower chest: No acute abnormality. Hepatobiliary: No focal liver abnormality is seen. Status post cholecystectomy. No biliary dilatation. Pancreas: Unremarkable. No pancreatic ductal dilatation or surrounding inflammatory changes. Spleen: Normal in size without significant abnormality. Adrenals/Urinary Tract: Adrenal glands are unremarkable. Kidneys are normal, without renal calculi, solid lesion, or hydronephrosis. Bladder is unremarkable. Stomach/Bowel: Stomach is within normal limits. Appendix appears normal. No evidence of bowel wall thickening, distention, or inflammatory changes. Vascular/Lymphatic: Scattered aortic atherosclerosis. No enlarged abdominal or pelvic lymph nodes. Reproductive: Status post hysterectomy. Other: No abdominal wall hernia or abnormality. No abdominopelvic ascites. Musculoskeletal: No acute or significant osseous findings. IMPRESSION: 1. No non-contrast CT findings of the abdomen or pelvis to explain pain. No evidence of bowel obstruction. 2. Status post cholecystectomy and hysterectomy. 3.  Aortic Atherosclerosis (ICD10-I70.0). Electronically Signed   By: Eddie Candle M.D.   On: 03/07/2020 12:27   DG Chest 2 View  Result Date: 03/05/2020 CLINICAL DATA:  Chest pain nausea and weakness EXAM: CHEST - 2 VIEW COMPARISON:  10/09/2017 FINDINGS: The heart size and mediastinal contours are  within normal limits. Both lungs are clear. The visualized skeletal structures are unremarkable. IMPRESSION: No active cardiopulmonary disease. Electronically Signed   By: Donavan Foil M.D.   On: 03/05/2020 00:44   US RENAL  Result Date: 03/11/2020 CLINICAL DATA:  Acute renal insufficiency EXAM: RENAL / URINARY TRACT ULTRASOUND COMPLETE COMPARISON:  CT abdomen pelvis 03/07/2020 FINDINGS: Right Kidney: Renal measurements: 11.9 x 4.7 x 6.8 cm = volume: 197 mL . Echogenicity within normal limits. No mass or hydronephrosis visualized. Left Kidney: Renal measurements: 11.4 x 5.2 x 5.8 cm = volume: 181 mL. Echogenicity within normal limits. No mass or hydronephrosis visualized. Bladder: Appears normal for degree of bladder distention. Other: None. IMPRESSION: Normal renal sonogram Electronically Signed   By: Fidela Salisbury MD   On: 03/11/2020 16:07     Time coordinating discharge: Over 30 minutes  SIGNED:   Guilford Shi, MD  [facility] 03/12/2020, 8:07 AM

## 2020-03-12 NOTE — Plan of Care (Signed)
Plan of care reviewed and discussed with the patient. 

## 2020-05-15 ENCOUNTER — Ambulatory Visit: Payer: Medicare Other | Attending: Internal Medicine

## 2020-05-15 ENCOUNTER — Other Ambulatory Visit: Payer: Self-pay

## 2020-05-15 DIAGNOSIS — Z23 Encounter for immunization: Secondary | ICD-10-CM

## 2020-05-15 NOTE — Progress Notes (Signed)
   Covid-19 Vaccination Clinic  Name:  Kirsten Shaffer    MRN: [id number] DOB: 18-Feb-1966  05/15/2020  Kirsten Shaffer was observed post Covid-19 immunization for 15 minutes without incident. She was provided with Vaccine Information Sheet and instruction to access the V-Safe system.   Kirsten Shaffer was instructed to call 911 with any severe reactions post vaccine: Marland Kitchen Difficulty breathing  . Swelling of face and throat  . A fast heartbeat  . A bad rash all over body  . Dizziness and weakness

## 2020-10-27 ENCOUNTER — Emergency Department (HOSPITAL_COMMUNITY): Payer: Medicare Other

## 2020-10-27 ENCOUNTER — Encounter (HOSPITAL_COMMUNITY): Payer: Self-pay

## 2020-10-27 ENCOUNTER — Other Ambulatory Visit: Payer: Self-pay

## 2020-10-27 ENCOUNTER — Emergency Department (HOSPITAL_COMMUNITY)
Admission: EM | Admit: 2020-10-27 | Discharge: 2020-10-27 | Disposition: A | Discharge status: Home / Self Care | Payer: Medicare Other | Attending: Emergency Medicine | Admitting: Emergency Medicine

## 2020-10-27 DIAGNOSIS — Z20822 Contact with and (suspected) exposure to covid-19: Secondary | ICD-10-CM | POA: Diagnosis not present

## 2020-10-27 DIAGNOSIS — R197 Diarrhea, unspecified: Secondary | ICD-10-CM | POA: Diagnosis not present

## 2020-10-27 DIAGNOSIS — I129 Hypertensive chronic kidney disease with stage 1 through stage 4 chronic kidney disease, or unspecified chronic kidney disease: Secondary | ICD-10-CM | POA: Insufficient documentation

## 2020-10-27 DIAGNOSIS — R8271 Bacteriuria: Secondary | ICD-10-CM | POA: Diagnosis not present

## 2020-10-27 DIAGNOSIS — E1122 Type 2 diabetes mellitus with diabetic chronic kidney disease: Secondary | ICD-10-CM | POA: Diagnosis not present

## 2020-10-27 DIAGNOSIS — R1010 Upper abdominal pain, unspecified: Secondary | ICD-10-CM | POA: Insufficient documentation

## 2020-10-27 DIAGNOSIS — Z87891 Personal history of nicotine dependence: Secondary | ICD-10-CM | POA: Insufficient documentation

## 2020-10-27 DIAGNOSIS — I10 Essential (primary) hypertension: Secondary | ICD-10-CM

## 2020-10-27 DIAGNOSIS — R112 Nausea with vomiting, unspecified: Secondary | ICD-10-CM | POA: Diagnosis not present

## 2020-10-27 DIAGNOSIS — N183 Chronic kidney disease, stage 3 unspecified: Secondary | ICD-10-CM | POA: Diagnosis not present

## 2020-10-27 DIAGNOSIS — R0789 Other chest pain: Secondary | ICD-10-CM | POA: Diagnosis present

## 2020-10-27 DIAGNOSIS — Z794 Long term (current) use of insulin: Secondary | ICD-10-CM | POA: Diagnosis not present

## 2020-10-27 DIAGNOSIS — J45909 Unspecified asthma, uncomplicated: Secondary | ICD-10-CM | POA: Diagnosis not present

## 2020-10-27 DIAGNOSIS — Z79899 Other long term (current) drug therapy: Secondary | ICD-10-CM | POA: Diagnosis not present

## 2020-10-27 LAB — URINALYSIS, ROUTINE W REFLEX MICROSCOPIC
Bilirubin Urine: NEGATIVE
Glucose, UA: 50 mg/dL — AB
Ketones, ur: NEGATIVE mg/dL
Leukocytes,Ua: NEGATIVE
Nitrite: NEGATIVE
Protein, ur: NEGATIVE mg/dL
Specific Gravity, Urine: 1.009 (ref 1.005–1.030)
pH: 5 (ref 5.0–8.0)

## 2020-10-27 LAB — COMPREHENSIVE METABOLIC PANEL
ALT: 23 U/L (ref 0–44)
AST: 45 U/L — ABNORMAL HIGH (ref 15–41)
Albumin: 4.2 g/dL (ref 3.5–5.0)
Alkaline Phosphatase: 107 U/L (ref 38–126)
Anion gap: 11 (ref 5–15)
BUN: 41 mg/dL — ABNORMAL HIGH (ref 6–20)
CO2: 20 mmol/L — ABNORMAL LOW (ref 22–32)
Calcium: 9.7 mg/dL (ref 8.9–10.3)
Chloride: 110 mmol/L (ref 98–111)
Creatinine, Ser: 1.54 mg/dL — ABNORMAL HIGH (ref 0.44–1.00)
GFR, Estimated: 40 mL/min — ABNORMAL LOW (ref >60)
Glucose, Bld: 181 mg/dL — ABNORMAL HIGH (ref 70–99)
Potassium: 5.5 mmol/L — ABNORMAL HIGH (ref 3.5–5.1)
Sodium: 141 mmol/L (ref 135–145)
Total Bilirubin: 0.8 mg/dL (ref 0.3–1.2)
Total Protein: 8.9 g/dL — ABNORMAL HIGH (ref 6.5–8.1)

## 2020-10-27 LAB — CBC WITH DIFFERENTIAL/PLATELET
Abs Immature Granulocytes: 0.04 10*3/uL (ref 0.00–0.07)
Basophils Absolute: 0 10*3/uL (ref 0.0–0.1)
Basophils Relative: 0 %
Eosinophils Absolute: 0.1 10*3/uL (ref 0.0–0.5)
Eosinophils Relative: 1 %
HCT: 35.2 % — ABNORMAL LOW (ref 36.0–46.0)
Hemoglobin: 11 g/dL — ABNORMAL LOW (ref 12.0–15.0)
Immature Granulocytes: 0 %
Lymphocytes Relative: 6 %
Lymphs Abs: 0.7 10*3/uL (ref 0.7–4.0)
MCH: 25.1 pg — ABNORMAL LOW (ref 26.0–34.0)
MCHC: 31.3 g/dL (ref 30.0–36.0)
MCV: 80.2 fL (ref 80.0–100.0)
Monocytes Absolute: 0.3 10*3/uL (ref 0.1–1.0)
Monocytes Relative: 2 %
Neutro Abs: 10.5 10*3/uL — ABNORMAL HIGH (ref 1.7–7.7)
Neutrophils Relative %: 91 %
Platelets: UNDETERMINED 10*3/uL (ref 150–400)
RBC: 4.39 MIL/uL (ref 3.87–5.11)
RDW: 15.7 % — ABNORMAL HIGH (ref 11.5–15.5)
WBC: 11.7 10*3/uL — ABNORMAL HIGH (ref 4.0–10.5)
nRBC: 0 % (ref 0.0–0.2)

## 2020-10-27 LAB — POC SARS CORONAVIRUS 2 AG -  ED: SARS Coronavirus 2 Ag: NEGATIVE

## 2020-10-27 LAB — CBG MONITORING, ED: Glucose-Capillary: 164 mg/dL — ABNORMAL HIGH (ref 70–99)

## 2020-10-27 LAB — TROPONIN I (HIGH SENSITIVITY)
Troponin I (High Sensitivity): 3 ng/L (ref <18)
Troponin I (High Sensitivity): 4 ng/L (ref <18)

## 2020-10-27 LAB — POTASSIUM: Potassium: 5.1 mmol/L (ref 3.5–5.1)

## 2020-10-27 LAB — LIPASE, BLOOD: Lipase: 50 U/L (ref 11–51)

## 2020-10-27 MED ORDER — ONDANSETRON 8 MG PO TBDP: 8.0000 mg | ORAL_TABLET | Freq: Three times a day (TID) | ORAL | 0 refills | Status: DC | PRN

## 2020-10-27 MED ORDER — METOCLOPRAMIDE HCL 5 MG/ML IJ SOLN
5.0000 mg | Freq: Once | INTRAMUSCULAR | Status: AC
  Administered 2020-10-27: 5 mg via INTRAVENOUS
  Filled 2020-10-27: qty 2

## 2020-10-27 MED ORDER — ONDANSETRON HCL 4 MG/2ML IJ SOLN: 4.0000 mg | Freq: Once | INTRAMUSCULAR | Status: AC

## 2020-10-27 MED ORDER — MORPHINE SULFATE (PF) 4 MG/ML IV SOLN
4.0000 mg | Freq: Once | INTRAVENOUS | Status: AC
Start: 2020-10-27 — End: 2020-10-27
  Administered 2020-10-27: 4 mg via INTRAVENOUS
  Filled 2020-10-27: qty 1

## 2020-10-27 MED ORDER — SODIUM CHLORIDE 0.9 % IV BOLUS
1000.0000 mL | Freq: Once | INTRAVENOUS | Status: AC
  Administered 2020-10-27: 1000 mL via INTRAVENOUS

## 2020-10-27 MED ORDER — ALUM & MAG HYDROXIDE-SIMETH 200-200-20 MG/5ML PO SUSP
30.0000 mL | Freq: Once | ORAL | Status: AC
  Administered 2020-10-27: 30 mL via ORAL
  Filled 2020-10-27: qty 30

## 2020-10-27 MED ORDER — IOHEXOL 350 MG/ML SOLN
100.0000 mL | Freq: Once | INTRAVENOUS | Status: AC | PRN
  Administered 2020-10-27: 80 mL via INTRAVENOUS

## 2020-10-27 MED ORDER — ONDANSETRON 4 MG PO TBDP
4.0000 mg | ORAL_TABLET | Freq: Once | ORAL | Status: AC
  Administered 2020-10-27: 4 mg via ORAL
  Filled 2020-10-27: qty 1

## 2020-10-27 MED ORDER — SODIUM CHLORIDE 0.9 % IV BOLUS
500.0000 mL | Freq: Once | INTRAVENOUS | Status: AC
Start: 2020-10-27 — End: 2020-10-27
  Administered 2020-10-27: 500 mL via INTRAVENOUS

## 2020-10-27 MED ORDER — DIPHENHYDRAMINE HCL 50 MG/ML IJ SOLN: 25.0000 mg | Freq: Once | INTRAMUSCULAR | Status: DC | PRN

## 2020-10-27 NOTE — Discharge Instructions (Signed)
As discussed, your evaluation today has been largely reassuring.  But, it is important that you monitor your condition carefully, and do not hesitate to return to the ED if you develop new, or concerning changes in your condition. ? ?Otherwise, please follow-up with your physician for appropriate ongoing care. ? ?

## 2020-10-27 NOTE — ED Notes (Signed)
IV team to bedside at this time

## 2020-10-27 NOTE — ED Provider Notes (Signed)
[facility] DEPT Provider Note   CSN: [id number] Arrival date & time: 10/27/20  1059     History Chief Complaint  Patient presents with  . Nausea  . Vomiting  . Diarrhea    Kirsten Shaffer is a 55 y.o. female with a past medical history of DM, legally blind, CKD, hypertension, transaminitis, who presents today for evaluation of nausea vomiting and diarrhea.  She states that she woke up at 4 AM with nausea vomiting and diarrhea.  She states that she has pressure in her chest when she vomits however otherwise does not have any chest pain or discomfort.  She states that she has vomited about 7 times and had about 3 episodes of diarrhea since this started.  She denies any blood in her vomit or diarrhea.  She denies any questionable food. She has had both her Covid vaccines.  She denies cough or shortness of breath. She was unable to tolerate her antihypertensives this morning. No fevers.    HPI     Past Medical History:  Diagnosis Date  . Asthma   . Diabetes mellitus   . Glaucoma   . Hypercholesterolemia   . Hypertension   . Legally blind   . Sickle cell trait [facility])     Patient Active Problem List   Diagnosis Date Noted  . Intractable nausea and vomiting 03/07/2020  . DKA (diabetic ketoacidoses) 03/07/2020  . CKD (chronic kidney disease), stage III ([place]) 03/07/2020  . DM (diabetes mellitus), type 2 ([place]) 03/07/2020  . Essential hypertension 03/07/2020  . AKI (acute kidney injury) ([place]) 03/07/2020  . Transaminitis 03/07/2020  . Lipoma of back 05/24/2012    Past Surgical History:  Procedure Laterality Date  . ABDOMINAL HYSTERECTOMY    . CATARACT EXTRACTION, BILATERAL    . CESAREAN SECTION    . CHOLECYSTECTOMY    . RETINAL DETACHMENT SURGERY     bilaterally  . stent eye       OB History   No obstetric history on file.     Family History  Problem Relation Age of Onset  . Diabetes Mother   . Heart failure Mother     Social  History   Tobacco Use  . Smoking status: Former Research scientist (life sciences)  . Smokeless tobacco: Never Used  Vaping Use  . Vaping Use: Never used  Substance Use Topics  . Alcohol use: Yes    Comment: ocassionally  . Drug use: No    Home Medications Prior to Admission medications   Medication Sig Start Date End Date Taking? Authorizing Provider  albuterol (PROVENTIL HFA;VENTOLIN HFA) 108 (90 BASE) MCG/ACT inhaler Inhale 1-2 puffs into the lungs every 6 (six) hours as needed for wheezing or shortness of breath. 09/04/14  Yes Pamella Pert, MD  HUMALOG KWIKPEN 100 UNIT/ML KwikPen Inject 1-10 Units into the skin 3 (three) times daily. Sliding scale 08/12/20  Yes [provider]  insulin NPH Human (HUMULIN N,NOVOLIN N) 100 UNIT/ML injection Inject 2-10 Units into the skin in the morning, at noon, and at bedtime. Sliding scale   Yes [provider]  losartan (COZAAR) 100 MG tablet Take 100 mg by mouth daily. 10/21/20  Yes [provider]  potassium chloride (KLOR-CON) 10 MEQ tablet Take 10 mEq by mouth daily. 08/28/20  Yes [provider]  simvastatin (ZOCOR) 40 MG tablet Take 40 mg by mouth at bedtime. 09/13/17  Yes [provider]  TOUJEO MAX SOLOSTAR 300 UNIT/ML Solostar Pen Inject 30 Units into the  skin at bedtime. 02/01/20  Yes [provider]  VITAMIN D PO Take 1 capsule by mouth daily.   Yes [provider]  amLODipine (NORVASC) 10 MG tablet Take 1 tablet (10 mg total) by mouth daily. Patient not taking: No sig reported 03/12/20   Guilford Shi, MD  blood glucose meter kit and supplies KIT Dispense based on patient and insurance preference. Use up to four times daily as directed. (FOR ICD-9 250.00, 250.01). 10/09/17   Couture, Cortni S, PA-C  carvedilol (COREG) 3.125 MG tablet Take 1 tablet (3.125 mg total) by mouth 2 (two) times daily with a meal. Patient not taking: No sig reported 03/12/20   Guilford Shi, MD  Nutritional Supplements  (,FEEDING SUPPLEMENT, PROSOURCE PLUS) liquid Take 30 mLs by mouth 2 (two) times daily between meals. Patient not taking: No sig reported 03/12/20   Guilford Shi, MD  ondansetron (ZOFRAN) 4 MG tablet Take 1 tablet (4 mg total) by mouth every 6 (six) hours as needed for nausea. Patient not taking: No sig reported 03/12/20   Guilford Shi, MD  ondansetron (ZOFRAN-ODT) 8 MG disintegrating tablet Take 8 mg by mouth 3 (three) times daily as needed for nausea/vomiting. Patient not taking: Reported on 10/27/2020 03/06/20   [provider]  sertraline (ZOLOFT) 50 MG tablet Take 1 tablet (50 mg total) by mouth daily. Patient not taking: Reported on 10/27/2020 09/24/18 03/07/20  Cobos, Myer Peer, MD    Allergies    Metformin and related  Review of Systems   Review of Systems  Constitutional: Negative for chills and fever.  HENT: Negative for congestion.   Respiratory: Negative for cough and shortness of breath.   Cardiovascular: Positive for chest pain (Only when vomiting).  Gastrointestinal: Positive for abdominal pain, diarrhea, nausea and vomiting. Negative for blood in stool.  Musculoskeletal: Negative for arthralgias and neck pain.  Neurological: Negative for weakness and headaches.  All other systems reviewed and are negative.   Physical Exam Updated Vital Signs BP (!) 171/89   Pulse (!) 102   Temp 98.1 F (36.7 C) (Oral)   Resp 16   Ht 5' 7"  (1.702 m)   Wt 121.1 kg   LMP 09/05/2014   SpO2 97%   BMI 41.82 kg/m   Physical Exam Vitals and nursing note reviewed.  Constitutional:      Appearance: She is obese.     Comments: Holding empty vomit bag, appears to feel unwell  HENT:     Head: Normocephalic and atraumatic.  Eyes:     General: No scleral icterus.       Right eye: No discharge.        Left eye: No discharge.     Conjunctiva/sclera: Conjunctivae normal.  Cardiovascular:     Rate and Rhythm: Normal rate and regular rhythm.     Pulses: Normal pulses.      Heart sounds: Normal heart sounds.  Pulmonary:     Effort: Pulmonary effort is normal. No respiratory distress.     Breath sounds: No stridor.  Abdominal:     General: There is no distension.     Tenderness: There is abdominal tenderness (Diffuse across upper abdomen). There is no guarding or rebound.  Musculoskeletal:        General: No deformity.     Cervical back: Normal range of motion.     Comments: +1 pitting edema bilaterally.  Skin:    General: Skin is warm and dry.  Neurological:     Mental Status:  She is alert.     Motor: No abnormal muscle tone.     Comments: Patient is awake and alert.  Speech is not slurred.  She answers questions appropriately without difficulty.  Psychiatric:        Mood and Affect: Mood normal.        Behavior: Behavior normal.     ED Results / Procedures / Treatments   Labs (all labs ordered are listed, but only abnormal results are displayed) Labs Reviewed  COMPREHENSIVE METABOLIC PANEL - Abnormal; Notable for the following components:      Result Value   Potassium 5.5 (*)    CO2 20 (*)    Glucose, Bld 181 (*)    BUN 41 (*)    Creatinine, Ser 1.54 (*)    Total Protein 8.9 (*)    AST 45 (*)    GFR, Estimated 40 (*)    All other components within normal limits  CBC WITH DIFFERENTIAL/PLATELET - Abnormal; Notable for the following components:   WBC 11.7 (*)    Hemoglobin 11.0 (*)    HCT 35.2 (*)    MCH 25.1 (*)    RDW 15.7 (*)    Neutro Abs 10.5 (*)    All other components within normal limits  URINALYSIS, ROUTINE W REFLEX MICROSCOPIC - Abnormal; Notable for the following components:   Glucose, UA 50 (*)    Hgb urine dipstick SMALL (*)    Bacteria, UA MANY (*)    All other components within normal limits  CBG MONITORING, ED - Abnormal; Notable for the following components:   Glucose-Capillary 164 (*)    All other components within normal limits  URINE CULTURE  LIPASE, BLOOD  POTASSIUM  TROPONIN I (HIGH SENSITIVITY)  TROPONIN I  (HIGH SENSITIVITY)    EKG EKG Interpretation  Date/Time:  Wednesday October 27 2020 11:08:28 EDT Ventricular Rate:  75 PR Interval:  116 QRS Duration: 92 QT Interval:  374 QTC Calculation: 418 R Axis:   62 Text Interpretation: Sinus arrhythmia Borderline short PR interval Confirmed by Lavenia Atlas [phone]) on 10/27/2020 12:51:39 PM   Radiology No results found.  Procedures Procedures   Medications Ordered in ED Medications  ondansetron (ZOFRAN) injection 4 mg ( Intravenous See Alternative 10/27/20 1131)    Or  ondansetron (ZOFRAN-ODT) disintegrating tablet 4 mg (4 mg Oral Given 10/27/20 1131)  sodium chloride 0.9 % bolus 500 mL (0 mLs Intravenous Stopped 10/27/20 1617)  metoCLOPramide (REGLAN) injection 5 mg (5 mg Intravenous Given 10/27/20 1414)  metoCLOPramide (REGLAN) injection 5 mg (5 mg Intravenous Given 10/27/20 1526)  alum & mag hydroxide-simeth (MAALOX/MYLANTA) 200-200-20 MG/5ML suspension 30 mL (30 mLs Oral Given 10/27/20 1616)  morphine 4 MG/ML injection 4 mg (4 mg Intravenous Given 10/27/20 1630)  iohexol (OMNIPAQUE) 350 MG/ML injection 100 mL (80 mLs Intravenous Contrast Given 10/27/20 1750)    ED Course  I have reviewed the triage vital signs and the nursing notes.  Pertinent labs & imaging results that were available during my care of the patient were reviewed by me and considered in my medical decision making (see chart for details).  Clinical Course as of 10/27/20 1750  Wed Oct 27, 2020  1248 Patient still doesn't have IV access.  [EH]  1301 Potassium(!): 5.5 Moderate hemolysis.  [EH]  1531 Potassium: 5.1 Repeat is improved.  [EH]  1623 Patient has now become tachycardic.  She is dry heaving after GI cocktail.  Will scan abdomen.  [EH]    Clinical Course  User Index [EH] Ollen Gross   MDM Rules/Calculators/A&P                         Kirsten Shaffer is a 55 year old woman who presents today for evaluation of abdominal pain, nausea, and  vomiting since 4 AM.  She is treated with Zofran, and IV fluids and still remains nauseous.  She was given Reglan x2 and then was unable to pass p.o. challenge of GI cocktail.  She did note that she has chest pain only when she vomits. EKG obtained without arrhythmia or acute ischemia. While in the emergency room patient did become tachycardic with heart rates into the 110s.  Troponin x2 is negative however patient voiced that she had developed constant chest pain.  Work-up shows mild leukocytosis at 11.7.  CBG was 164.  Her initial potassium was elevated at 5.5, however moderate hemolysis was noted, and repeat was 5.1.  Her creatinine is elevated consistent with her usual baseline.  Lipase is not significantly elevated.  UA does have many bacteria.  Patient is not specifically having urinary frequency, urgency or dysuria, has many bacteria with 6-10 whites and 0-5 reds, urine culture is sent. Given that patient was unable to tolerate p.o. challenge despite multiple rounds of antiemetics CT scan will be obtained. Additionally given her degree of hypertension with chest pain CT dissection study is ordered.  Chest x-ray is ordered.  At shift change care was transferred to Dr. Vanita Panda who will follow pending studies, re-evaulate and determine disposition.      Final Clinical Impression(s) / ED Diagnoses Final diagnoses:  Nausea, vomiting, and diarrhea  Pain of upper abdomen  Hypertension, unspecified type  Bacteriuria    Rx / DC Orders ED Discharge Orders    None       Ollen Gross 10/27/20 1750    Carmin Muskrat, MD 10/27/20 2207

## 2020-10-27 NOTE — ED Triage Notes (Signed)
Coming from home, nvd since 0400

## 2020-10-27 NOTE — ED Notes (Signed)
Patient assisted to and from bedside commode.

## 2020-10-27 NOTE — ED Notes (Signed)
IV attempt x1, unsuccessful

## 2020-10-29 LAB — URINE CULTURE: Culture: 80000 — AB

## 2020-10-30 ENCOUNTER — Telehealth: Payer: Self-pay | Admitting: Emergency Medicine

## 2020-10-30 NOTE — Progress Notes (Signed)
ED Antimicrobial Stewardship Positive Culture Follow Up   Kirsten Shaffer is an 55 y.o. female who presented to [facility] on 10/27/2020 with a chief complaint of  Chief Complaint  Patient presents with  . Nausea  . Vomiting  . Diarrhea    Recent Results (from the past 720 hour(s))  Urine culture     Status: Abnormal   Collection Time: 10/27/20  1:32 PM   Specimen: Urine, Random  Result Value Ref Range Status   Specimen Description   Final    URINE, RANDOM Performed at [facility] [address] [place], [address] [address]    Special Requests   Final    NONE Performed at [facility], [place] [address] [place], [place] [address]    Culture 80,000 COLONIES/mL ESCHERICHIA COLI (A)  Final   Report Status 10/29/2020 FINAL  Final   Organism ID, Bacteria ESCHERICHIA COLI (A)  Final      Susceptibility   Escherichia coli - MIC*    AMPICILLIN >=32 RESISTANT Resistant     CEFAZOLIN 8 SENSITIVE Sensitive     CEFEPIME <=0.12 SENSITIVE Sensitive     CEFTRIAXONE <=0.25 SENSITIVE Sensitive     CIPROFLOXACIN <=0.25 SENSITIVE Sensitive     GENTAMICIN <=1 SENSITIVE Sensitive     IMIPENEM <=0.25 SENSITIVE Sensitive     NITROFURANTOIN 32 SENSITIVE Sensitive     TRIMETH/SULFA >=320 RESISTANT Resistant     AMPICILLIN/SULBACTAM >=32 RESISTANT Resistant     PIP/TAZO <=4 SENSITIVE Sensitive     * 80,000 COLONIES/mL ESCHERICHIA COLI    [x]  Patient discharged originally without antimicrobial agent and treatment is now indicated  New antibiotic prescription: cephalexin 500mg  PO bid x 5 days  ED Provider: Dr. Memory Dance, PharmD, BCPS 10/30/2020, 8:55 AM Clinical Pharmacist [phone]

## 2020-10-30 NOTE — Telephone Encounter (Signed)
Post ED Visit - Positive Culture Follow-up: Unsuccessful Patient Follow-up  Culture assessed and recommendations reviewed by:  []  Elenor Quinones, Pharm.D. []  Heide Guile, Pharm.D., BCPS AQ-ID []  Parks Neptune, Pharm.D., BCPS []  Alycia Rossetti, Pharm.D., BCPS []  [place], Pharm.D., BCPS, AAHIVP []  Legrand Como, Pharm.D., BCPS, AAHIVP [x]  Peggyann Juba, PharmD []  Vincenza Hews, PharmD, BCPS  Positive urine culture  [x]  Patient discharged without antimicrobial prescription and treatment is now indicated []  Organism is resistant to prescribed ED discharge antimicrobial []  Patient with positive blood cultures   Unable to contact patient by phone number on file - no answer, voicemail box full, letter will be sent to address on file  Plan:  Keflex 500 mg PO BID x five days for UTI -  Octaviano Glow, MD  [name] 10/30/2020, 4:32 PM

## 2020-11-05 ENCOUNTER — Telehealth: Payer: Self-pay | Admitting: *Deleted

## 2020-11-05 NOTE — Telephone Encounter (Signed)
Post ED Visit - Positive Culture Follow-up: Successful Patient Follow-Up  Culture assessed and recommendations reviewed by:  []  Elenor Quinones, Pharm.D. []  Heide Guile, Pharm.D., BCPS AQ-ID []  Parks Neptune, Pharm.D., BCPS []  Alycia Rossetti, Pharm.D., BCPS []  [place], [place].D., BCPS, AAHIVP []  Legrand Como, Pharm.D., BCPS, AAHIVP []  Salome Arnt, PharmD, BCPS []  Johnnette Gourd, PharmD, BCPS []  Hughes Better, PharmD, BCPS []  Leeroy Cha, PharmD  Positive urine culture  [x]  Patient discharged without antimicrobial prescription and treatment is now indicated []  Organism is resistant to prescribed ED discharge antimicrobial []  Patient with positive blood cultures  Changes discussed with ED provider Dr. Octaviano Glow New antibiotic prescription Keflex 500mg  PO BID x 5 days Called to [facility] [phone]  Contacted by patient in response to letter sent to address on file. date 11/05/2020, time [place], [place] 11/05/2020, 1:20 PM

## 2021-07-18 IMAGING — CT CT ANGIO CHEST-ABD-PELV FOR DISSECTION W/ AND WO/W CM
2 of 8 series · 13 of 46 positions shown, 15 images · IV contrast (omnipaque)
Comparison: Chest radiograph earlier today.

CLINICAL DATA: Abdominal pain, aortic dissection suspected Chest
pain, abdominal pain, became tachy, vomiting, hypertensive

EXAM:
CT ANGIOGRAPHY CHEST, ABDOMEN AND PELVIS
TECHNIQUE: Non-contrast CT of the chest was initially obtained. Multidetector
CT imaging through the chest, abdomen and pelvis was performed using
the standard protocol during bolus administration of intravenous
contrast. Multiplanar reconstructed images and MIPs were obtained
and reviewed to evaluate the vascular anatomy.
CONTRAST:  80mL OMNIPAQUE IOHEXOL 350 MG/ML SOLN

[Series 6: axial arterial · axial · arterial · 0.83mm/px · z∈[+972,+1500]mm · 10 of 216 slices shown, 12 images]
[im 20/216  soft-tissue]
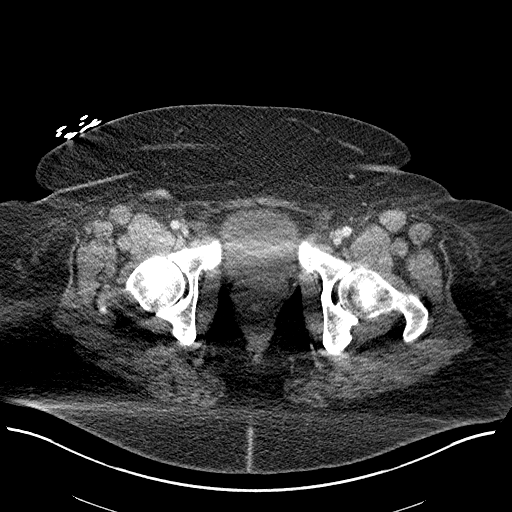
[im 20/216  bone]
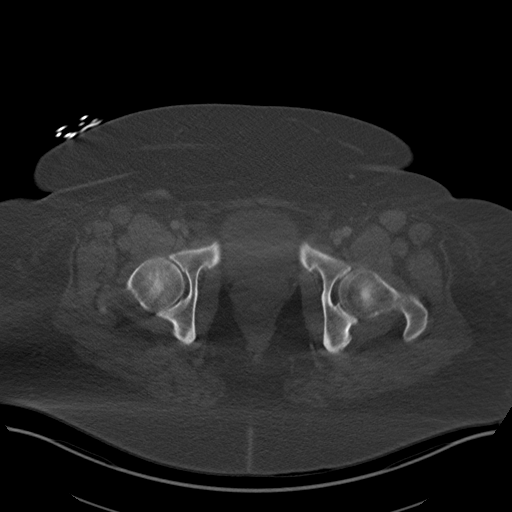
[im 40/216  soft-tissue]
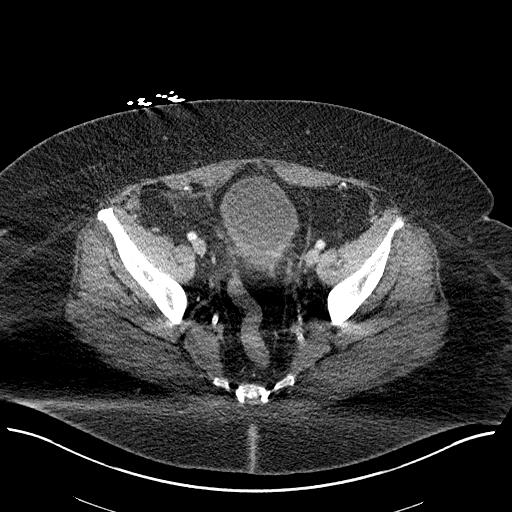
[im 59/216  soft-tissue]
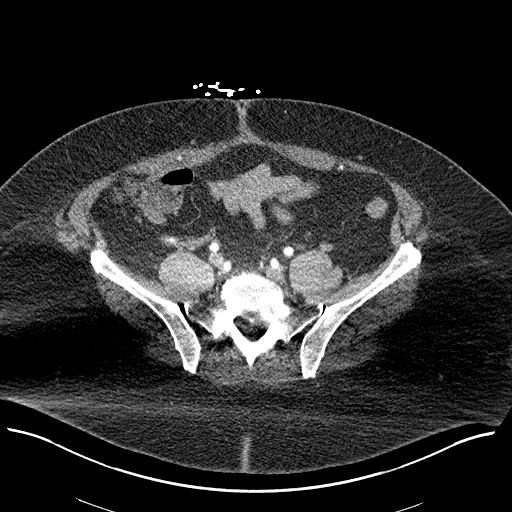
[im 79/216  soft-tissue]
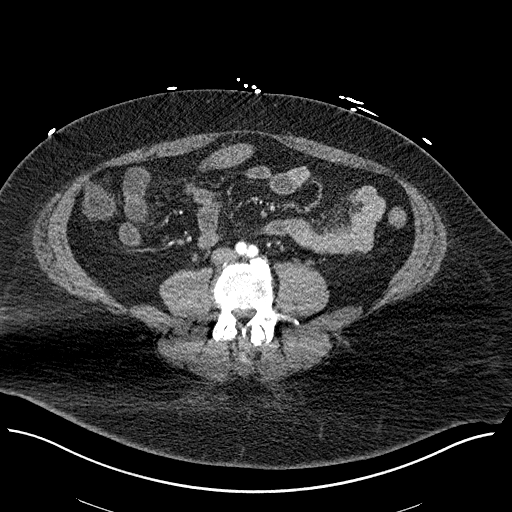
[im 98/216  soft-tissue]
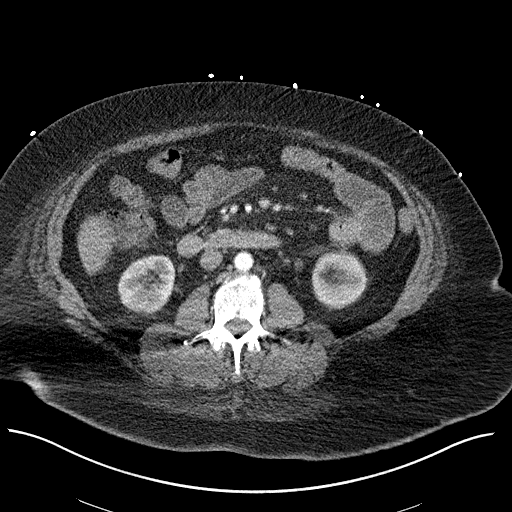
[im 118/216  soft-tissue]
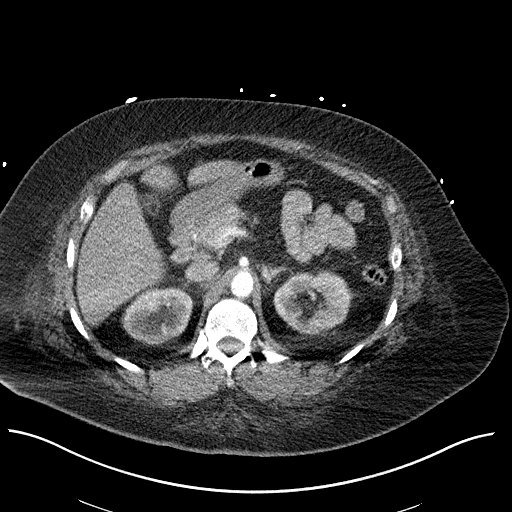
[im 137/216  soft-tissue]
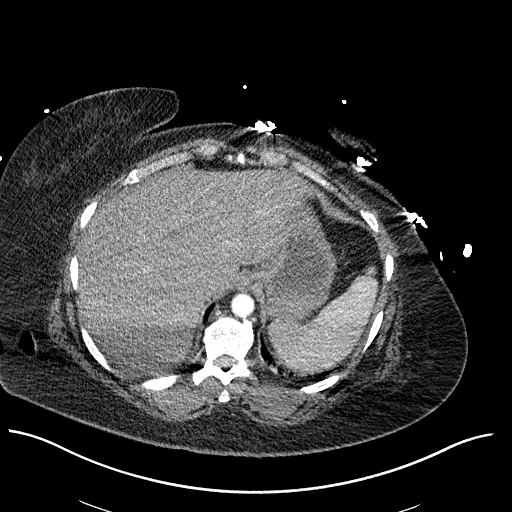
[im 157/216  soft-tissue]
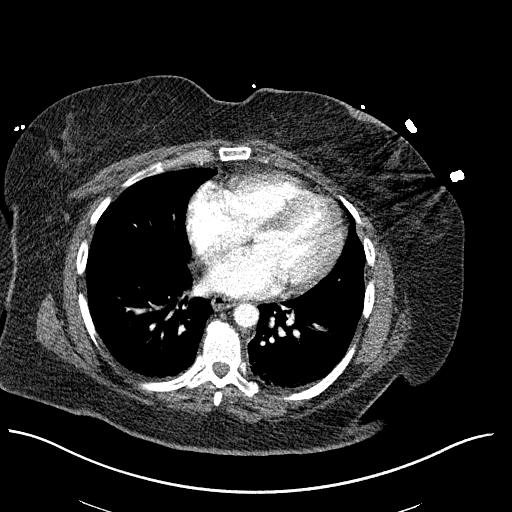
[im 176/216  soft-tissue]
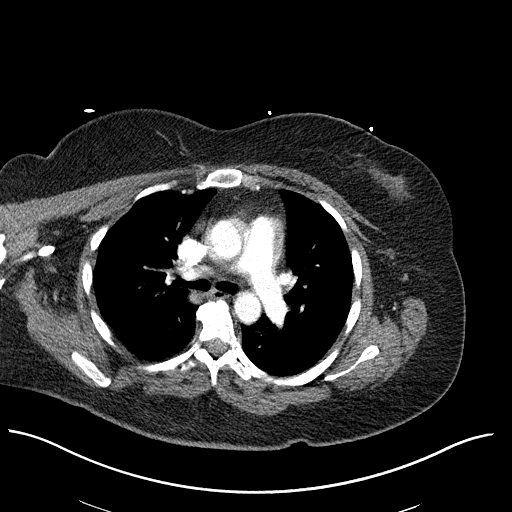
[im 176/216  bone]
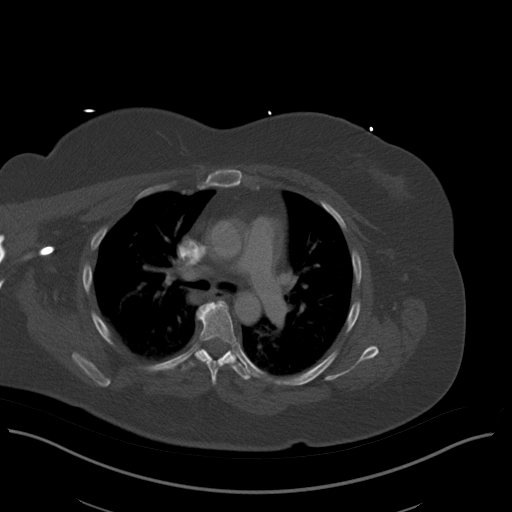
[im 196/216  soft-tissue]
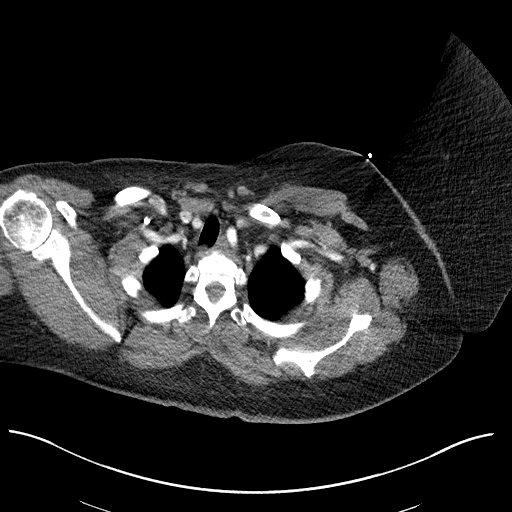

[Series 7: coronals · coronal · 1.03mm/px · 3 of 190 slices shown]
[im 48/190  soft-tissue]
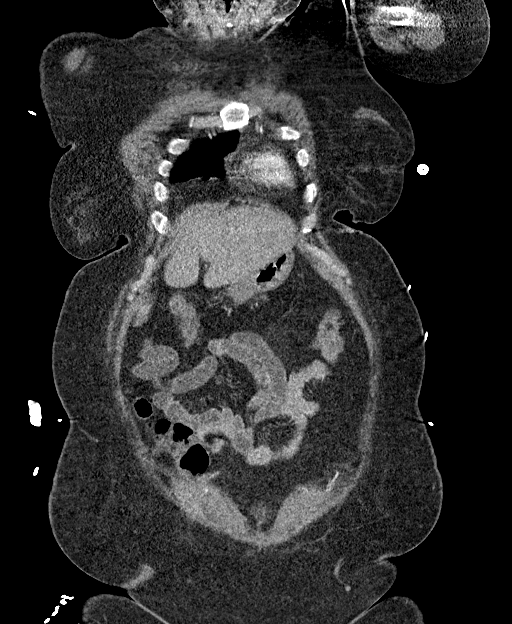
[im 95/190  soft-tissue]
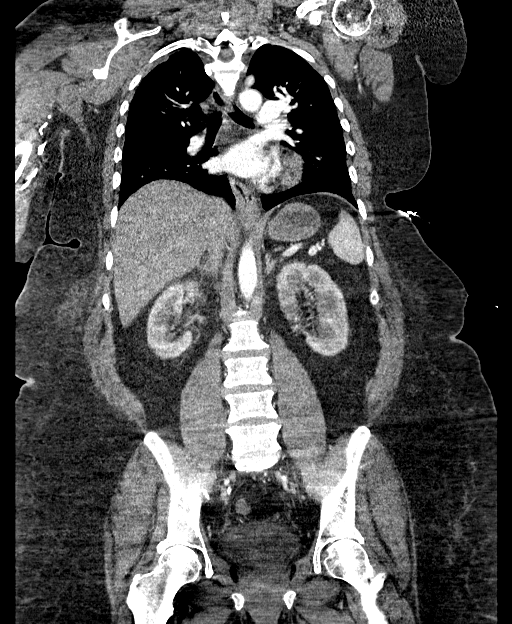
[im 142/190  soft-tissue]
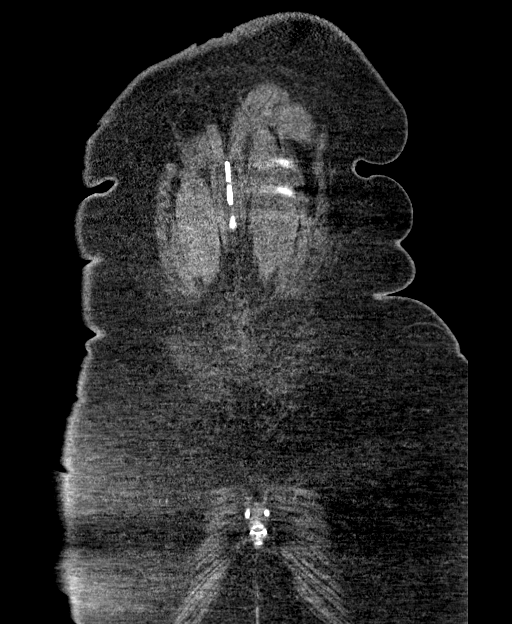

[13 of 46 positions shown; findings below may reference images not displayed]

FINDINGS: CTA CHEST FINDINGS

Cardiovascular: There is significant cardiac and respiratory motion
artifact through the aortic root and ascending aorta. Assessment for
dissection in this region is sick Arissa Billiot limited due to motion
artifact. Linear density in the ascending aorta is also seen in the
adjacent pulmonary artery, and felt to be due to cardiac motion. No
aortic aneurysm. No evidence of aortic hematoma noncontrast exam.
There is a common origin of the brachiocephalic and left common
carotid artery, variant arch anatomy. No significant
atherosclerosis. Heart is normal in size. No pericardial fluid. No
central pulmonary embolus to the lobar level.

Mediastinum/Nodes: No adenopathy. No thyroid nodule. Distal
esophagus is patulous with minimal wall thickening

Lungs/Pleura: Breathing motion artifact. Minor subsegmental
atelectasis right lower lobe. No acute or confluent airspace
disease. No pleural fluid.

Musculoskeletal: Midthoracic spondylosis with anterior spurring.
There are no acute or suspicious osseous abnormalities. No chest
wall soft tissue abnormality.

Review of the MIP images confirms the above findings.

CTA ABDOMEN AND PELVIS FINDINGS

VASCULAR

Aorta: Minimal aortic atherosclerosis. Normal in caliber. No
aneurysm, dissection, vasculitis or significant stenosis.

Celiac: Patent without evidence of aneurysm, dissection, vasculitis
or significant stenosis.

SMA: Patent without evidence of aneurysm, dissection, vasculitis or
significant stenosis.

Renals: Both renal arteries are patent without evidence of aneurysm,
dissection, vasculitis, fibromuscular dysplasia or significant
stenosis.

IMA: Patent without evidence of aneurysm, dissection, vasculitis or
significant stenosis.

Inflow: Patent without evidence of aneurysm, dissection, vasculitis
or significant stenosis.

Veins: No obvious venous abnormality within the limitations of this
arterial phase study.

Review of the MIP images confirms the above findings.

NON-VASCULAR

Hepatobiliary: No focal hepatic abnormality. Cholecystectomy. No
biliary dilatation.

Pancreas: No ductal dilatation or inflammation.

Spleen: Normal in size and arterial enhancement.

Adrenals/Urinary Tract: Normal adrenal glands. No hydronephrosis. No
significant perinephric edema. No evidence of focal renal
abnormality. Decompressed ureters. Unremarkable bladder.

Stomach/Bowel: Stomach is within normal limits. Appendix appears
normal. No evidence of bowel wall thickening, distention, or
inflammatory changes.

Lymphatic: No adenopathy.

Reproductive: Status post hysterectomy. No adnexal masses.

Other: Tiny fat containing umbilical hernia. No free air or ascites.

Musculoskeletal: Lumbar spondylosis with anterior spurring. Lower
lumbar facet hypertrophy. Bilateral hip osteoarthritis. There are no
acute or suspicious osseous abnormalities.

Review of the MIP images confirms the above findings.
IMPRESSION: 1. Breathing and cardiac motion artifact obscure evaluation of the
aortic root and ascending aorta. Linear density through the
ascending aorta is felt to be secondary to motion artifact.
Recommend correlation with echocardiogram. If there are findings
suspicious for dissection on echocardiogram, a gated CTA could be
considered.
2. The remainder of the thoracoabdominal aorta is normal, other than
a small amount of calcified plaque in the abdominal aorta.
3. Patulous distal esophagus with minimal wall thickening, can be
seen with reflux or esophagitis.
4. No other acute findings in the chest, abdomen, or pelvis.

Aortic Atherosclerosis ([id number].V).

## 2021-12-06 ENCOUNTER — Ambulatory Visit
Admission: RE | Admit: 2021-12-06 | Discharge: 2021-12-06 | Disposition: A | Discharge status: Home / Self Care | Payer: Medicare Other | Source: Ambulatory Visit | Attending: Internal Medicine | Admitting: Internal Medicine

## 2021-12-06 ENCOUNTER — Other Ambulatory Visit: Payer: Self-pay | Admitting: Internal Medicine

## 2021-12-06 DIAGNOSIS — J189 Pneumonia, unspecified organism: Secondary | ICD-10-CM

## 2022-08-27 IMAGING — CR DG CHEST 2V
2 series · 2 of 2 positions shown · non-contrast
Comparison: None Available.

CLINICAL DATA: Pneumonia, organism unspecified(486)

EXAM:
CHEST - 2 VIEW

[w chest pa]
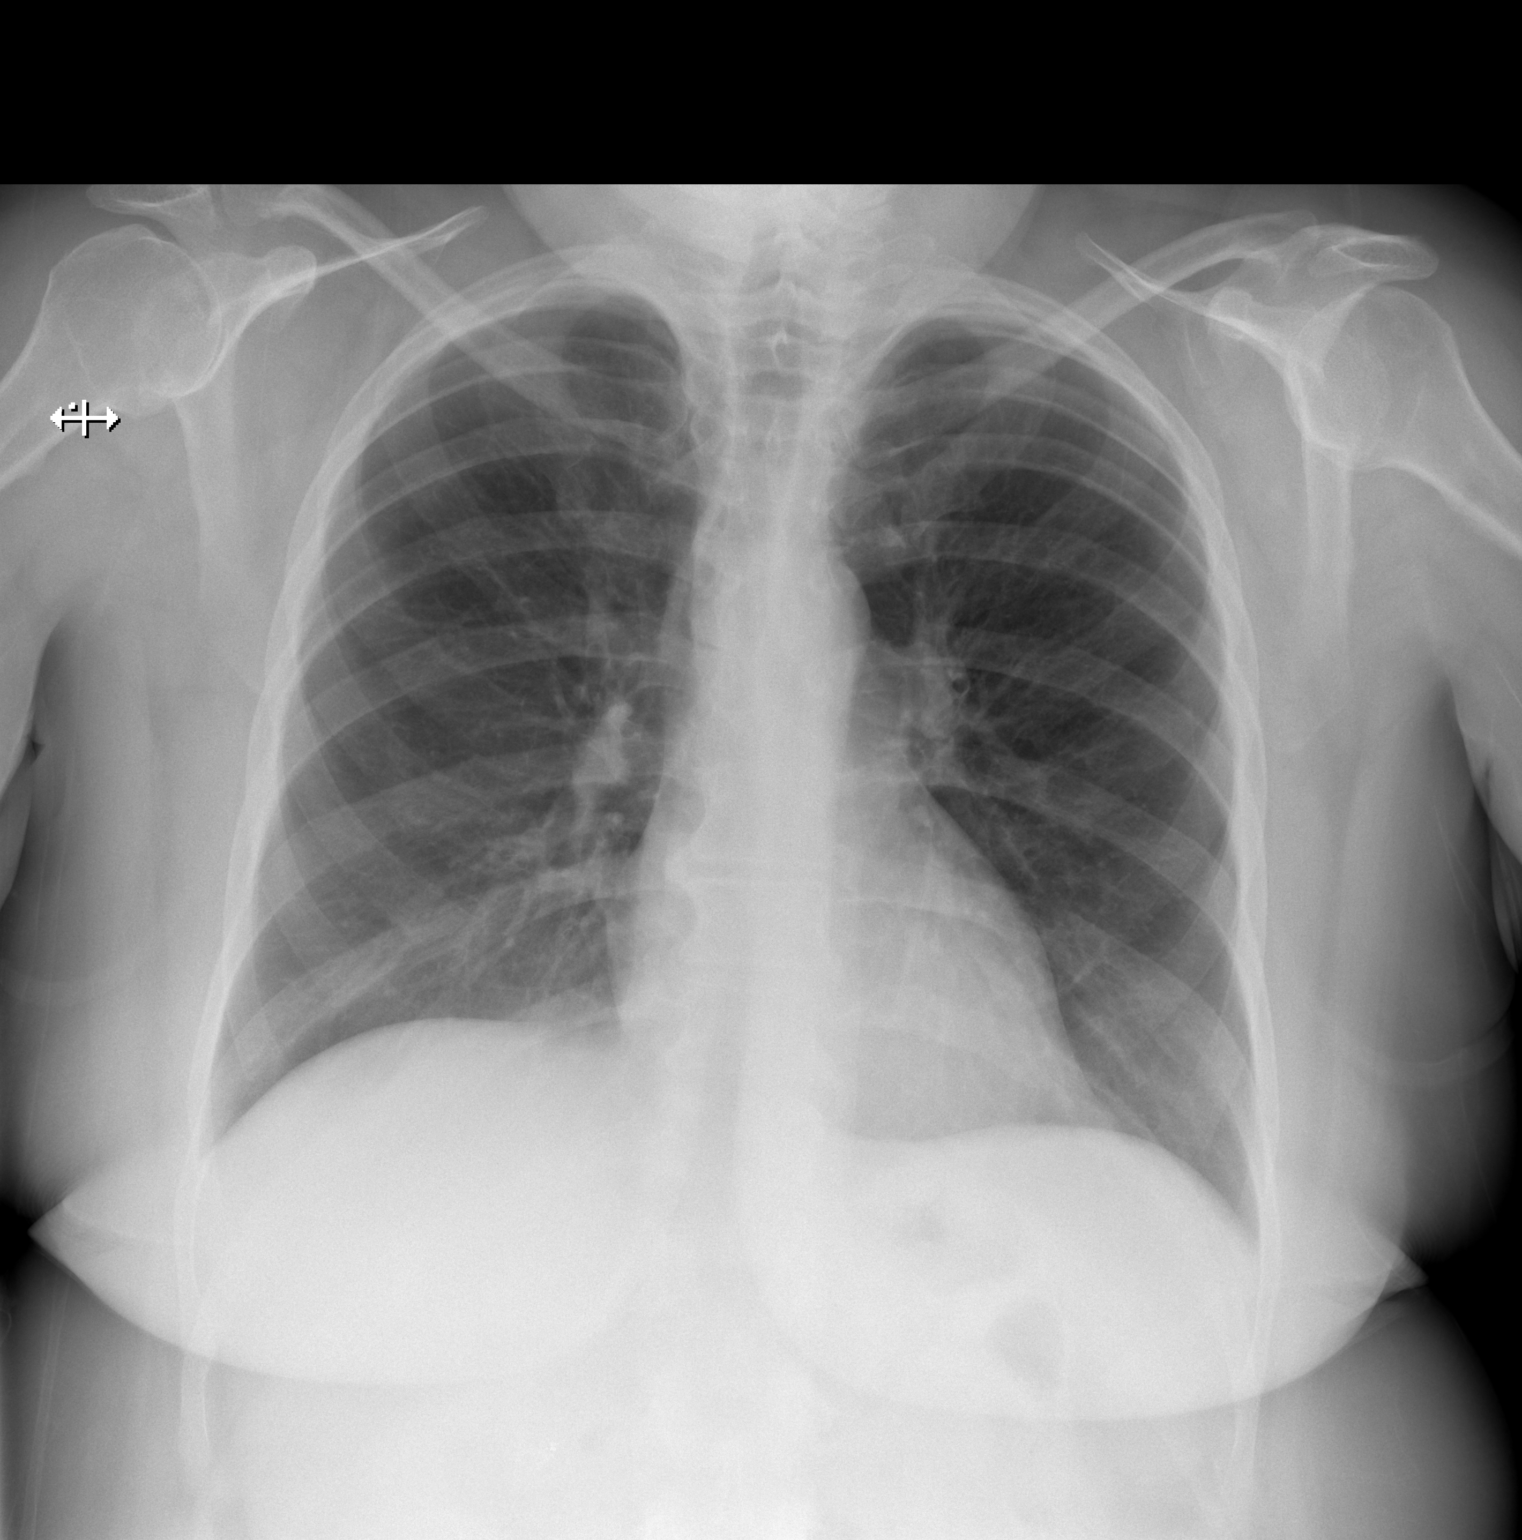

[w chest lat]
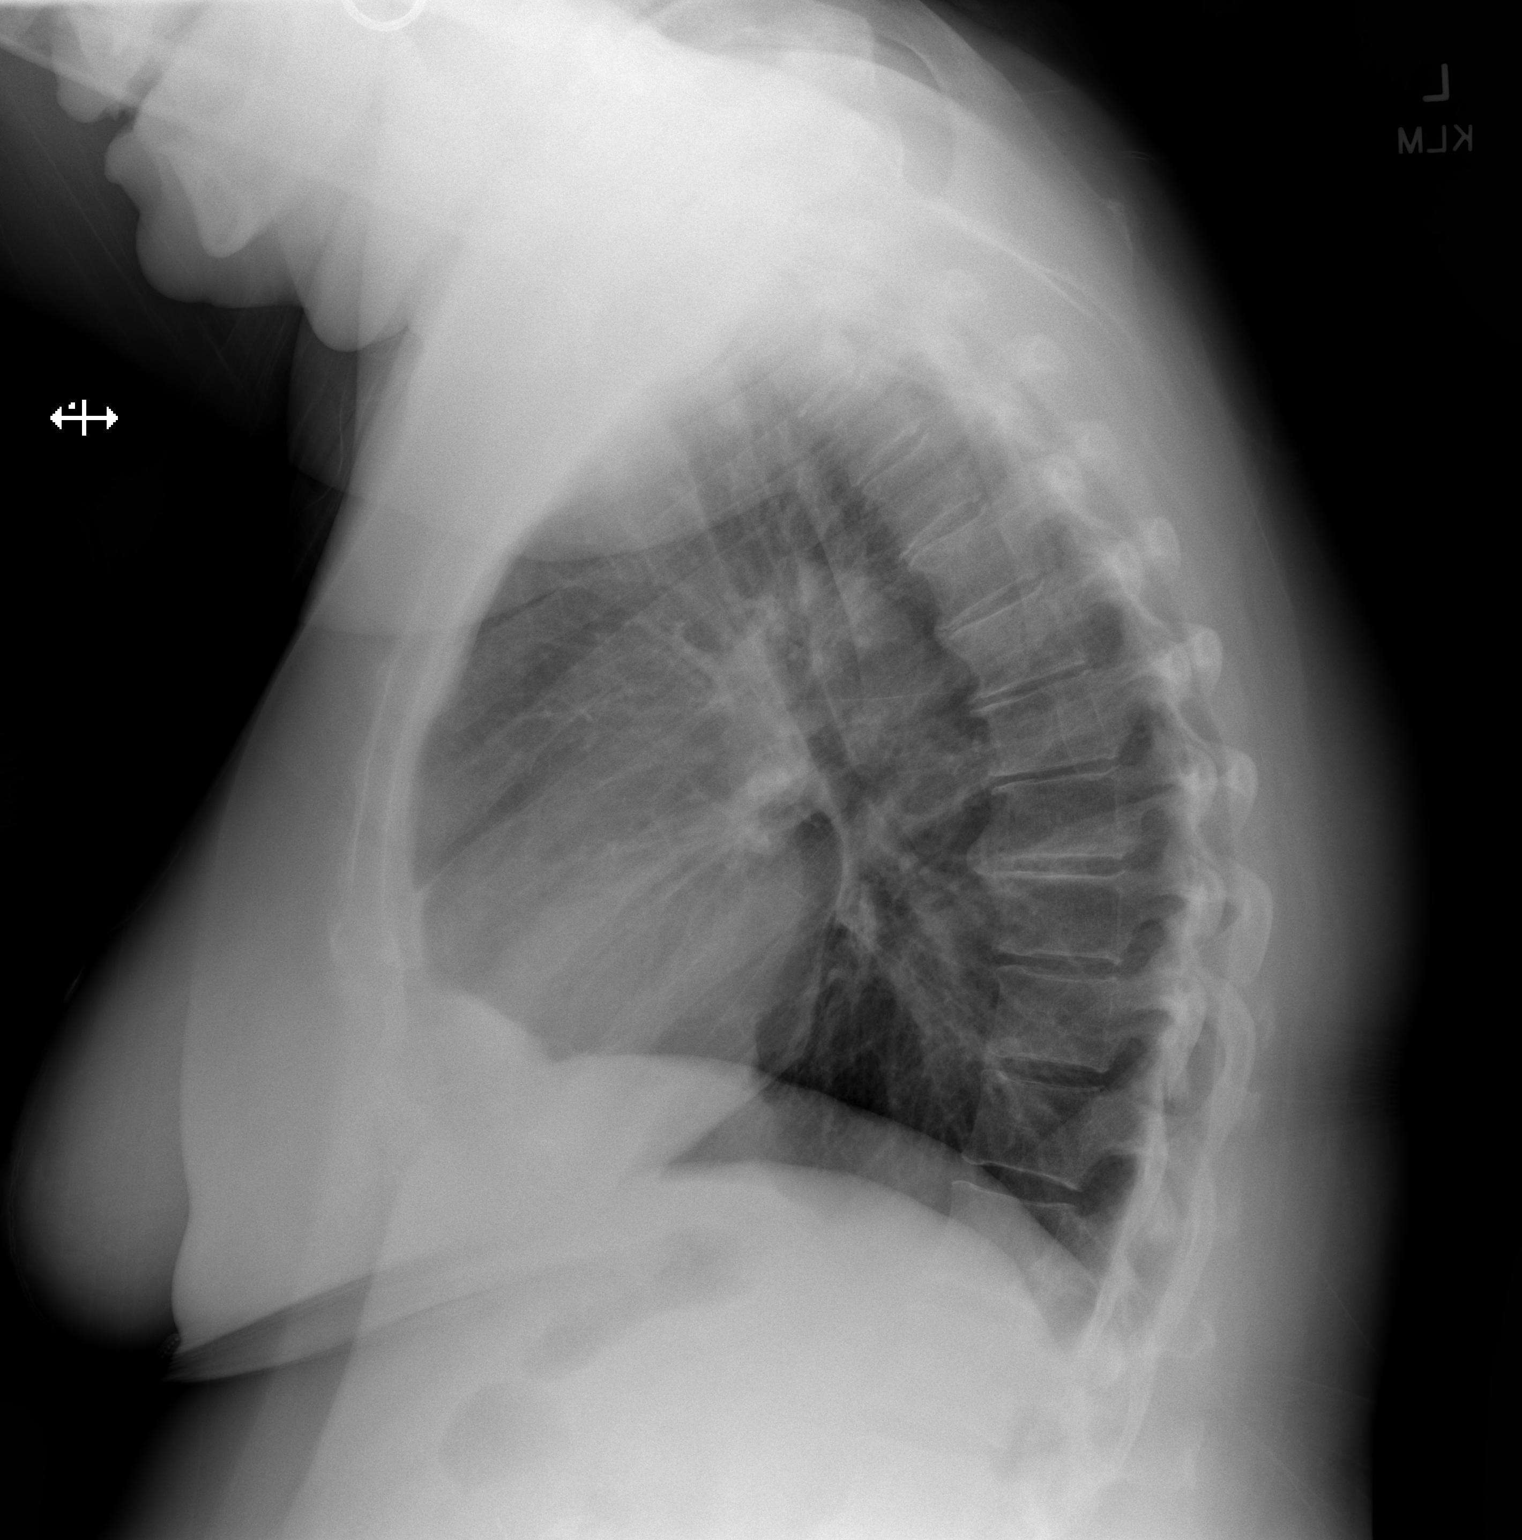

[2 of 2 positions shown; findings below may reference images not displayed]

FINDINGS: The heart size and mediastinal contours are within normal limits.
Both lungs are clear. The visualized skeletal structures are
unremarkable.
IMPRESSION: No active cardiopulmonary disease and no significant interval
change.

## 2023-07-26 ENCOUNTER — Ambulatory Visit
Admission: RE | Admit: 2023-07-26 | Discharge: 2023-07-26 | Disposition: A | Discharge status: Home / Self Care | Payer: 59 | Source: Ambulatory Visit | Attending: Family Medicine | Admitting: Family Medicine

## 2023-07-26 ENCOUNTER — Other Ambulatory Visit: Payer: Self-pay | Admitting: Family Medicine

## 2023-07-26 DIAGNOSIS — R062 Wheezing: Secondary | ICD-10-CM

## 2023-10-24 ENCOUNTER — Other Ambulatory Visit: Payer: Self-pay | Admitting: Family Medicine

## 2023-10-24 DIAGNOSIS — Z1231 Encounter for screening mammogram for malignant neoplasm of breast: Secondary | ICD-10-CM

## 2023-11-10 ENCOUNTER — Emergency Department (HOSPITAL_BASED_OUTPATIENT_CLINIC_OR_DEPARTMENT_OTHER)

## 2023-11-10 ENCOUNTER — Observation Stay (HOSPITAL_BASED_OUTPATIENT_CLINIC_OR_DEPARTMENT_OTHER)
Admission: EM | Admit: 2023-11-10 | Discharge: 2023-11-12 | Disposition: A | Discharge status: Home / Self Care | Attending: Family Medicine | Admitting: Family Medicine

## 2023-11-10 ENCOUNTER — Encounter (HOSPITAL_BASED_OUTPATIENT_CLINIC_OR_DEPARTMENT_OTHER): Payer: Self-pay

## 2023-11-10 DIAGNOSIS — R112 Nausea with vomiting, unspecified: Secondary | ICD-10-CM | POA: Diagnosis not present

## 2023-11-10 DIAGNOSIS — E872 Acidosis, unspecified: Secondary | ICD-10-CM | POA: Insufficient documentation

## 2023-11-10 DIAGNOSIS — I129 Hypertensive chronic kidney disease with stage 1 through stage 4 chronic kidney disease, or unspecified chronic kidney disease: Secondary | ICD-10-CM | POA: Diagnosis not present

## 2023-11-10 DIAGNOSIS — Z79899 Other long term (current) drug therapy: Secondary | ICD-10-CM | POA: Diagnosis not present

## 2023-11-10 DIAGNOSIS — R55 Syncope and collapse: Secondary | ICD-10-CM | POA: Diagnosis not present

## 2023-11-10 DIAGNOSIS — E785 Hyperlipidemia, unspecified: Secondary | ICD-10-CM | POA: Insufficient documentation

## 2023-11-10 DIAGNOSIS — Z1152 Encounter for screening for COVID-19: Secondary | ICD-10-CM | POA: Diagnosis not present

## 2023-11-10 DIAGNOSIS — Z299 Encounter for prophylactic measures, unspecified: Secondary | ICD-10-CM | POA: Insufficient documentation

## 2023-11-10 DIAGNOSIS — R110 Nausea: Secondary | ICD-10-CM | POA: Diagnosis present

## 2023-11-10 DIAGNOSIS — N183 Chronic kidney disease, stage 3 unspecified: Secondary | ICD-10-CM | POA: Diagnosis not present

## 2023-11-10 DIAGNOSIS — Z8639 Personal history of other endocrine, nutritional and metabolic disease: Secondary | ICD-10-CM | POA: Diagnosis not present

## 2023-11-10 DIAGNOSIS — E8729 Other acidosis: Secondary | ICD-10-CM | POA: Insufficient documentation

## 2023-11-10 DIAGNOSIS — N179 Acute kidney failure, unspecified: Secondary | ICD-10-CM | POA: Diagnosis not present

## 2023-11-10 LAB — CBG MONITORING, ED
Glucose-Capillary: 86 mg/dL (ref 70–99)
Glucose-Capillary: 115 mg/dL — ABNORMAL HIGH (ref 70–99)

## 2023-11-10 LAB — COMPREHENSIVE METABOLIC PANEL WITH GFR
ALT: 11 U/L (ref 0–44)
AST: 14 U/L — ABNORMAL LOW (ref 15–41)
Albumin: 4.2 g/dL (ref 3.5–5.0)
Alkaline Phosphatase: 125 U/L (ref 38–126)
Anion gap: 13 (ref 5–15)
BUN: 64 mg/dL — ABNORMAL HIGH (ref 6–20)
CO2: 17 mmol/L — ABNORMAL LOW (ref 22–32)
Calcium: 9.8 mg/dL (ref 8.9–10.3)
Chloride: 111 mmol/L (ref 98–111)
Creatinine, Ser: 1.79 mg/dL — ABNORMAL HIGH (ref 0.44–1.00)
GFR, Estimated: 32 mL/min — ABNORMAL LOW (ref >60)
Glucose, Bld: 104 mg/dL — ABNORMAL HIGH (ref 70–99)
Potassium: 4.4 mmol/L (ref 3.5–5.1)
Sodium: 141 mmol/L (ref 135–145)
Total Bilirubin: 0.5 mg/dL (ref 0.0–1.2)
Total Protein: 8 g/dL (ref 6.5–8.1)

## 2023-11-10 LAB — CBC
HCT: 37.7 % (ref 36.0–46.0)
Hemoglobin: 12.3 g/dL (ref 12.0–15.0)
MCH: 24.4 pg — ABNORMAL LOW (ref 26.0–34.0)
MCHC: 32.6 g/dL (ref 30.0–36.0)
MCV: 74.7 fL — ABNORMAL LOW (ref 80.0–100.0)
Platelets: 235 10*3/uL (ref 150–400)
RBC: 5.05 MIL/uL (ref 3.87–5.11)
RDW: 15.3 % (ref 11.5–15.5)
WBC: 9.7 10*3/uL (ref 4.0–10.5)
nRBC: 0 % (ref 0.0–0.2)

## 2023-11-10 LAB — RESP PANEL BY RT-PCR (RSV, FLU A&B, COVID)  RVPGX2
Influenza A by PCR: NEGATIVE
Influenza B by PCR: NEGATIVE
Resp Syncytial Virus by PCR: NEGATIVE
SARS Coronavirus 2 by RT PCR: NEGATIVE

## 2023-11-10 LAB — TROPONIN I (HIGH SENSITIVITY): Troponin I (High Sensitivity): 4 ng/L (ref <18)

## 2023-11-10 LAB — LIPASE, BLOOD: Lipase: 56 U/L — ABNORMAL HIGH (ref 11–51)

## 2023-11-10 MED ORDER — LACTATED RINGERS IV BOLUS
1000.0000 mL | Freq: Once | INTRAVENOUS | Status: AC
Start: 2023-11-10 — End: 2023-11-11
  Administered 2023-11-11: 1000 mL via INTRAVENOUS

## 2023-11-10 MED ORDER — ONDANSETRON HCL 4 MG/2ML IJ SOLN
4.0000 mg | Freq: Once | INTRAMUSCULAR | Status: AC | PRN
  Administered 2023-11-10: 4 mg via INTRAVENOUS
  Filled 2023-11-10: qty 2

## 2023-11-10 MED ORDER — DROPERIDOL 2.5 MG/ML IJ SOLN
1.2500 mg | Freq: Once | INTRAMUSCULAR | Status: AC
  Administered 2023-11-11: 1.25 mg via INTRAVENOUS
  Filled 2023-11-10: qty 2

## 2023-11-10 MED ORDER — METOCLOPRAMIDE HCL 5 MG/ML IJ SOLN
10.0000 mg | Freq: Once | INTRAMUSCULAR | Status: AC
  Administered 2023-11-10: 10 mg via INTRAVENOUS
  Filled 2023-11-10: qty 2

## 2023-11-10 MED ORDER — DIPHENHYDRAMINE HCL 50 MG/ML IJ SOLN
25.0000 mg | Freq: Once | INTRAMUSCULAR | Status: AC
  Administered 2023-11-10: 25 mg via INTRAVENOUS
  Filled 2023-11-10: qty 1

## 2023-11-10 MED ORDER — LACTATED RINGERS IV BOLUS
1000.0000 mL | Freq: Once | INTRAVENOUS | Status: AC
  Administered 2023-11-10: 1000 mL via INTRAVENOUS

## 2023-11-10 NOTE — ED Triage Notes (Signed)
 Patient is from home. About 2.5 hours ago she had a syncopal episode at home. Patient reports she had been feeling dizzy all morning. No thinners. Patient now reports nausea and vomiting at home. Hematoma above the right eyebrow.

## 2023-11-10 NOTE — ED Notes (Signed)
 Patient transported to CT

## 2023-11-11 ENCOUNTER — Other Ambulatory Visit: Payer: Self-pay

## 2023-11-11 DIAGNOSIS — R55 Syncope and collapse: Secondary | ICD-10-CM | POA: Insufficient documentation

## 2023-11-11 DIAGNOSIS — R112 Nausea with vomiting, unspecified: Secondary | ICD-10-CM | POA: Diagnosis not present

## 2023-11-11 DIAGNOSIS — E8729 Other acidosis: Secondary | ICD-10-CM | POA: Insufficient documentation

## 2023-11-11 LAB — URINALYSIS, ROUTINE W REFLEX MICROSCOPIC
Bilirubin Urine: NEGATIVE
Glucose, UA: >=500 mg/dL — AB
Hgb urine dipstick: NEGATIVE
Ketones, ur: NEGATIVE mg/dL
Nitrite: NEGATIVE
Protein, ur: NEGATIVE mg/dL
Specific Gravity, Urine: 1.008 (ref 1.005–1.030)
pH: 5 (ref 5.0–8.0)

## 2023-11-11 LAB — MAGNESIUM: Magnesium: 2.2 mg/dL (ref 1.7–2.4)

## 2023-11-11 LAB — CBC
HCT: 35.5 % — ABNORMAL LOW (ref 36.0–46.0)
Hemoglobin: 10.8 g/dL — ABNORMAL LOW (ref 12.0–15.0)
MCH: 24.2 pg — ABNORMAL LOW (ref 26.0–34.0)
MCHC: 30.4 g/dL (ref 30.0–36.0)
MCV: 79.6 fL — ABNORMAL LOW (ref 80.0–100.0)
Platelets: 207 10*3/uL (ref 150–400)
RBC: 4.46 MIL/uL (ref 3.87–5.11)
RDW: 15.5 % (ref 11.5–15.5)
WBC: 11 10*3/uL — ABNORMAL HIGH (ref 4.0–10.5)
nRBC: 0 % (ref 0.0–0.2)

## 2023-11-11 LAB — RAPID URINE DRUG SCREEN, HOSP PERFORMED
Amphetamines: NOT DETECTED
Barbiturates: NOT DETECTED
Benzodiazepines: NOT DETECTED
Cocaine: NOT DETECTED
Opiates: NOT DETECTED
Tetrahydrocannabinol: NOT DETECTED

## 2023-11-11 LAB — BASIC METABOLIC PANEL WITH GFR
Anion gap: 7 (ref 5–15)
BUN: 59 mg/dL — ABNORMAL HIGH (ref 6–20)
CO2: 20 mmol/L — ABNORMAL LOW (ref 22–32)
Calcium: 9 mg/dL (ref 8.9–10.3)
Chloride: 113 mmol/L — ABNORMAL HIGH (ref 98–111)
Creatinine, Ser: 1.56 mg/dL — ABNORMAL HIGH (ref 0.44–1.00)
GFR, Estimated: 38 mL/min — ABNORMAL LOW (ref >60)
Glucose, Bld: 53 mg/dL — ABNORMAL LOW (ref 70–99)
Potassium: 3.7 mmol/L (ref 3.5–5.1)
Sodium: 140 mmol/L (ref 135–145)

## 2023-11-11 LAB — GLUCOSE, CAPILLARY
Glucose-Capillary: 59 mg/dL — ABNORMAL LOW (ref 70–99)
Glucose-Capillary: 209 mg/dL — ABNORMAL HIGH (ref 70–99)
Glucose-Capillary: 99 mg/dL (ref 70–99)
Glucose-Capillary: 117 mg/dL — ABNORMAL HIGH (ref 70–99)

## 2023-11-11 LAB — TROPONIN I (HIGH SENSITIVITY): Troponin I (High Sensitivity): 4 ng/L (ref <18)

## 2023-11-11 LAB — PHOSPHORUS: Phosphorus: 3.1 mg/dL (ref 2.5–4.6)

## 2023-11-11 LAB — HIV ANTIBODY (ROUTINE TESTING W REFLEX): HIV Screen 4th Generation wRfx: NONREACTIVE

## 2023-11-11 LAB — LACTIC ACID, PLASMA: Lactic Acid, Venous: 0.8 mmol/L (ref 0.5–1.9)

## 2023-11-11 MED ORDER — ONDANSETRON HCL 4 MG/2ML IJ SOLN: 4.0000 mg | Freq: Four times a day (QID) | INTRAMUSCULAR | Status: DC | PRN

## 2023-11-11 MED ORDER — MELATONIN 3 MG PO TABS: 6.0000 mg | ORAL_TABLET | Freq: Every evening | ORAL | Status: DC | PRN

## 2023-11-11 MED ORDER — ACETAMINOPHEN 500 MG PO TABS: 1000.0000 mg | ORAL_TABLET | Freq: Four times a day (QID) | ORAL | Status: DC | PRN

## 2023-11-11 MED ORDER — METOCLOPRAMIDE HCL 5 MG/ML IJ SOLN
5.0000 mg | Freq: Three times a day (TID) | INTRAMUSCULAR | Status: DC
  Administered 2023-11-11 – 2023-11-12 (×3): 5 mg via INTRAVENOUS
  Filled 2023-11-11 (×4): qty 2

## 2023-11-11 MED ORDER — ROSUVASTATIN CALCIUM 10 MG PO TABS
10.0000 mg | ORAL_TABLET | Freq: Every day | ORAL | Status: DC
  Administered 2023-11-11 – 2023-11-12 (×2): 10 mg via ORAL
  Filled 2023-11-11 (×2): qty 1

## 2023-11-11 MED ORDER — INSULIN ASPART 100 UNIT/ML IJ SOLN
0.0000 [IU] | Freq: Three times a day (TID) | INTRAMUSCULAR | Status: DC
  Administered 2023-11-11: 5 [IU] via SUBCUTANEOUS
  Administered 2023-11-12: 3 [IU] via SUBCUTANEOUS

## 2023-11-11 MED ORDER — ENOXAPARIN SODIUM 60 MG/0.6ML IJ SOSY
50.0000 mg | PREFILLED_SYRINGE | INTRAMUSCULAR | Status: DC
  Administered 2023-11-11 – 2023-11-12 (×2): 50 mg via SUBCUTANEOUS
  Filled 2023-11-11 (×2): qty 0.6

## 2023-11-11 MED ORDER — INSULIN GLARGINE-YFGN 100 UNIT/ML ~~LOC~~ SOLN
40.0000 [IU] | Freq: Every day | SUBCUTANEOUS | Status: DC
  Administered 2023-11-11 – 2023-11-12 (×2): 40 [IU] via SUBCUTANEOUS
  Filled 2023-11-11 (×2): qty 0.4

## 2023-11-11 MED ORDER — ALBUTEROL SULFATE HFA 108 (90 BASE) MCG/ACT IN AERS: 2.0000 | INHALATION_SPRAY | Freq: Four times a day (QID) | RESPIRATORY_TRACT | Status: DC | PRN

## 2023-11-11 MED ORDER — INSULIN GLARGINE-YFGN 100 UNIT/ML ~~LOC~~ SOLN: 45.0000 [IU] | Freq: Every day | SUBCUTANEOUS | Status: DC

## 2023-11-11 MED ORDER — SODIUM CHLORIDE 0.9% FLUSH
3.0000 mL | Freq: Two times a day (BID) | INTRAVENOUS | Status: DC
Start: 2023-11-11 — End: 2023-11-12
  Administered 2023-11-11 – 2023-11-12 (×4): 3 mL via INTRAVENOUS

## 2023-11-11 MED ORDER — POLYETHYLENE GLYCOL 3350 17 G PO PACK: 17.0000 g | PACK | Freq: Every day | ORAL | Status: DC | PRN

## 2023-11-11 NOTE — Plan of Care (Signed)

## 2023-11-11 NOTE — H&P (Signed)
 History and Physical    Kirsten Shaffer ZOX:[id number] DOB: 03/27/1966 DOA: 11/10/2023  PCP: Pcp, No   Patient coming from: Transfer from [facility] ED   Chief Complaint:  Chief Complaint  Patient presents with   Fall   Nausea    HPI:  Kirsten Shaffer is a 58 y.o. female with hx of diabetes, hypertension, hyperlipidemia, CKD 3, who was transferred from [facility] ED after an episode of syncope.  Patient reports that last week she was transition from Ozempic to Rybelsus.  Due to worsening nausea she actually stopped the Rybelsus at the beginning the week (around Monday).  Reports that blood sugars have been running high this week in the 200s to 300s.  Yesterday morning she woke up and had 2 episodes of vomiting.  Denies any diarrhea, abdominal pain.  No fevers or chills.  No hematemesis/coffee-ground emesis, melena, hematochezia.  When she was getting up to use the bathroom she began to feel lightheaded and then had a syncopal episode fell forward hitting her face.  No incontinence.  No confusion afterwards.  No other recent illness.  Now feeling better after fluids in the ED.   Review of Systems:  ROS complete and negative except as marked above   Allergies  Allergen Reactions   Metformin And Related Nausea Only    Prior to Admission medications   Medication Sig Start Date End Date Taking? Authorizing Provider  albuterol (PROVENTIL HFA;VENTOLIN HFA) 108 (90 BASE) MCG/ACT inhaler Inhale 1-2 puffs into the lungs every 6 (six) hours as needed for wheezing or shortness of breath. 09/04/14   Mazie Speed, MD  amLODipine (NORVASC) 10 MG tablet Take 1 tablet (10 mg total) by mouth daily. Patient not taking: No sig reported 03/12/20   Michela Aguas, MD  blood glucose meter kit and supplies KIT Dispense based on patient and insurance preference. Use up to four times daily as directed. (FOR ICD-9 250.00, 250.01). 10/09/17   Couture, Cortni S, PA-C  carvedilol (COREG) 3.125 MG tablet Take 1 tablet  (3.125 mg total) by mouth 2 (two) times daily with a meal. Patient not taking: No sig reported 03/12/20   Michela Aguas, MD  HUMALOG KWIKPEN 100 UNIT/ML KwikPen Inject 1-10 Units into the skin 3 (three) times daily. Sliding scale 08/12/20   [provider]  insulin NPH Human (HUMULIN N,NOVOLIN N) 100 UNIT/ML injection Inject 2-10 Units into the skin in the morning, at noon, and at bedtime. Sliding scale    [provider]  losartan (COZAAR) 100 MG tablet Take 100 mg by mouth daily. 10/21/20   [provider]  Nutritional Supplements (,FEEDING SUPPLEMENT, PROSOURCE PLUS) liquid Take 30 mLs by mouth 2 (two) times daily between meals. Patient not taking: No sig reported 03/12/20   Michela Aguas, MD  ondansetron (ZOFRAN) 4 MG tablet Take 1 tablet (4 mg total) by mouth every 6 (six) hours as needed for nausea. Patient not taking: No sig reported 03/12/20   Michela Aguas, MD  ondansetron (ZOFRAN-ODT) 8 MG disintegrating tablet Take 1 tablet (8 mg total) by mouth 3 (three) times daily as needed. 10/27/20   Dorenda Gandy, MD  potassium chloride (KLOR-CON) 10 MEQ tablet Take 10 mEq by mouth daily. 08/28/20   [provider]  sertraline (ZOLOFT) 50 MG tablet Take 1 tablet (50 mg total) by mouth daily. Patient not taking: Reported on 10/27/2020 09/24/18 03/07/20  Cobos, Fernando A, MD  simvastatin (ZOCOR) 40 MG tablet Take 40 mg by mouth at bedtime. 09/13/17  [provider]  TOUJEO MAX SOLOSTAR 300 UNIT/ML Solostar Pen Inject 30 Units into the skin at bedtime. 02/01/20   [provider]  VITAMIN D PO Take 1 capsule by mouth daily.    [provider]    Past Medical History:  Diagnosis Date   Asthma    Diabetes mellitus    Glaucoma    Hypercholesterolemia    Hypertension    Legally blind    Sickle cell trait (HCC)     Past Surgical History:  Procedure Laterality Date   ABDOMINAL HYSTERECTOMY     CATARACT EXTRACTION, BILATERAL      CESAREAN SECTION     CHOLECYSTECTOMY     RETINAL DETACHMENT SURGERY     bilaterally   stent eye       reports that she has quit smoking. She has never used smokeless tobacco. She reports current alcohol use. She reports that she does not use drugs.  Family History  Problem Relation Age of Onset   Diabetes Mother    Heart failure Mother      Physical Exam: Vitals:   11/10/23 2024 11/10/23 2025 11/11/23 0000 11/11/23 0145  BP: (!) 150/71  (!) 149/59 (!) 164/69  Pulse: 85  99 100  Resp: 15  16 18   Temp: 98.6 F (37 C)  98.6 F (37 C) 99.8 F (37.7 C)  TempSrc: Oral  Oral   SpO2: 100%  94% 99%  Weight:  107 kg    Height:  5\' 7"  (1.702 m)      Gen: Awake, alert, NAD   HEENT: Ecchymosis along the right brow  CV: Regular, normal S1, S2, no murmurs  Resp: Normal WOB, CTAB  Abd: Flat, normoactive, nontender MSK: Symmetric, no edema  Skin: No rashes or lesions to exposed skin  Neuro: Alert and interactive  Psych: euthymic, appropriate    Data review:   Labs reviewed, notable for:   Bicarb 17, anion gap 13 Creatinine 1.7 (baseline 1.5-1.6) Lipase 56, LFT normal Blood counts unremarkable   Micro:  Results for orders placed or performed during the hospital encounter of 11/10/23  Resp panel by RT-PCR (RSV, Flu A&B, Covid) Anterior Nasal Swab     Status: None   Collection Time: 11/10/23  8:45 PM   Specimen: Anterior Nasal Swab  Result Value Ref Range Status   SARS Coronavirus 2 by RT PCR NEGATIVE NEGATIVE Final    Comment: (NOTE) SARS-CoV-2 target nucleic acids are NOT DETECTED.  The SARS-CoV-2 RNA is generally detectable in upper respiratory specimens during the acute phase of infection. The lowest concentration of SARS-CoV-2 viral copies this assay can detect is 138 copies/mL. A negative result does not preclude SARS-Cov-2 infection and should not be used as the sole basis for treatment or other patient management decisions. A negative result may occur with   improper specimen collection/handling, submission of specimen other than nasopharyngeal swab, presence of viral mutation(s) within the areas targeted by this assay, and inadequate number of viral copies(<138 copies/mL). A negative result must be combined with clinical observations, patient history, and epidemiological information. The expected result is Negative.  Fact Sheet for Patients:  BloggerCourse.com  Fact Sheet for Healthcare Providers:  SeriousBroker.it  This test is no t yet approved or cleared by the [place]  FDA and  has been authorized for detection and/or diagnosis of SARS-CoV-2 by FDA under an Emergency Use Authorization (EUA). This EUA will remain  in effect (meaning this test can be used) for the duration  of the COVID-19 declaration under Section 564(b)(1) of the Act, 21 U.S.C.section 360bbb-3(b)(1), unless the authorization is terminated  or revoked sooner.       Influenza A by PCR NEGATIVE NEGATIVE Final   Influenza B by PCR NEGATIVE NEGATIVE Final    Comment: (NOTE) The Xpert Xpress SARS-CoV-2/FLU/RSV plus assay is intended as an aid in the diagnosis of influenza from Nasopharyngeal swab specimens and should not be used as a sole basis for treatment. Nasal washings and aspirates are unacceptable for Xpert Xpress SARS-CoV-2/FLU/RSV testing.  Fact Sheet for Patients: BloggerCourse.com  Fact Sheet for Healthcare Providers: SeriousBroker.it  This test is not yet approved or cleared by the [place]  FDA and has been authorized for detection and/or diagnosis of SARS-CoV-2 by FDA under an Emergency Use Authorization (EUA). This EUA will remain in effect (meaning this test can be used) for the duration of the COVID-19 declaration under Section 564(b)(1) of the Act, 21 U.S.C. section 360bbb-3(b)(1), unless the authorization is terminated or revoked.      Resp Syncytial Virus by PCR NEGATIVE NEGATIVE Final    Comment: (NOTE) Fact Sheet for Patients: BloggerCourse.com  Fact Sheet for Healthcare Providers: SeriousBroker.it  This test is not yet approved or cleared by the [place]  FDA and has been authorized for detection and/or diagnosis of SARS-CoV-2 by FDA under an Emergency Use Authorization (EUA). This EUA will remain in effect (meaning this test can be used) for the duration of the COVID-19 declaration under Section 564(b)(1) of the Act, 21 U.S.C. section 360bbb-3(b)(1), unless the authorization is terminated or revoked.  Performed at [facility], [address], [place], [place] [address]     Imaging reviewed:  CT Head Wo Contrast Result Date: 11/10/2023 CLINICAL DATA:  Initial evaluation for acute head trauma. EXAM: CT HEAD WITHOUT CONTRAST TECHNIQUE: Contiguous axial images were obtained from the base of the skull through the vertex without intravenous contrast. RADIATION DOSE REDUCTION: This exam was performed according to the departmental dose-optimization program which includes automated exposure control, adjustment of the mA and/or kV according to patient size and/or use of iterative reconstruction technique. COMPARISON:  None Available. FINDINGS: Brain: Cerebral volume within normal limits for patient age. No acute intracranial hemorrhage. No acute large vessel territory infarct. No mass lesion, midline shift, or mass effect. Ventricles are normal in size without hydrocephalus. No extra-axial fluid collection. Vascular: No abnormal hyperdense vessel. Skull: Probable small soft tissue contusion at the right supraorbital region. Calvarium intact. 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 Globes orbital soft tissues demonstrate no acute finding. Visualized paranasal sinuses are largely clear. No significant mastoid effusion.  IMPRESSION: 1. No acute intracranial abnormality. 2. Probable small soft tissue contusion at the right supraorbital region. No underlying calvarial fracture. Electronically Signed   By: Virgia Griffins M.D.   On: 11/10/2023 23:27   CT ABDOMEN PELVIS WO CONTRAST Result Date: 11/10/2023 CLINICAL DATA:  Recent syncopal episode with abdominal pain EXAM: CT ABDOMEN AND PELVIS WITHOUT CONTRAST TECHNIQUE: Multidetector CT imaging of the abdomen and pelvis was performed following the standard protocol without IV contrast. RADIATION DOSE REDUCTION: This exam was performed according to the departmental dose-optimization program which includes automated exposure control, adjustment of the mA and/or kV according to patient size and/or use of iterative reconstruction technique. COMPARISON:  12/27/2020 FINDINGS: Lower chest: No acute abnormality. Hepatobiliary: No focal liver abnormality is seen. Status post cholecystectomy. No biliary dilatation. Pancreas: Unremarkable. No pancreatic ductal dilatation or surrounding inflammatory changes. Spleen:  Normal in size without focal abnormality. Adrenals/Urinary Tract: Adrenal glands are within normal limits. Kidneys are well visualize without renal calculi or obstructive change. The ureters are within normal limits. Bladder is well distended. Stomach/Bowel: The appendix is within normal limits. No obstructive or inflammatory changes of the colon are seen. Small bowel and stomach are within normal limits. Vascular/Lymphatic: No significant vascular findings are present. No enlarged abdominal or pelvic lymph nodes. Reproductive: Status post hysterectomy. No adnexal masses. Other: No abdominal wall hernia or abnormality. No abdominopelvic ascites. Musculoskeletal: Degenerative changes of the lumbar spine are noted. IMPRESSION: No acute abnormality to correspond with the given clinical history. Electronically Signed   By: Violeta Grey M.D.   On: 11/10/2023 22:55   DG Chest Port 1  View Result Date: 11/10/2023 CLINICAL DATA:  Syncope EXAM: PORTABLE CHEST 1 VIEW COMPARISON:  07/26/2023 FINDINGS: The heart size and mediastinal contours are within normal limits. Both lungs are clear. The visualized skeletal structures are unremarkable. IMPRESSION: No active disease. Electronically Signed   By: Janeece Mechanic M.D.   On: 11/10/2023 22:09   EKG: Personally reviewed, sinus rhythm with no acute ischemic changes  ED Course:  Treated with 2 L LR, Reglan, Benadryl, Zofran, droperidol. Recurrent N/V in ED, transferred for further eval syncope and intractable N/V.    Assessment/Plan:  58 y.o. female with hx diabetes, hypertension, hyperlipidemia, CKD 3, who was transferred from [facility] ED after an episode of syncope, intractable N/V.   Syncope with ground-level fall Prodrome with lightheadedness and syncope following episodes of nausea and vomiting suspicious for hypovolemia and possibly vagal episode.  EKG with no suggestion of structural disease, no ischemic changes. - S/p 2 L IV fluid, continue oral hydration - Check orthostatics in the morning - Telemonitoring  Nausea and vomiting Acute onset nausea vomiting x 1 day without other GI symptoms.  On initial evaluation borderline tachycardic, elevated temp at 37.7 but no fever.  Labs with anion gap acidosis, Lipase minimal elevation at 56, other LFT wnl. WBC 9.  Etiology less likely adverse effect of Rybelsus since stopped approximately a week ago.  May be developing viral gastritis/gastroenteritis. ? Toxinosis from food last night (wendys salad).  - Fluid management per above - Trial of carb controlled diet with antiemetics this morning. - Would hold Rybelsus at time of discharge given her recent GI illness.  Anion gap acidosis - Check lactate, UA for ketones. - If persistent anion gap acidosis or ketones on urine would check beta hydroxybutyrate, VBG to assess for possible euglycemic DKA (patient on SGLT2 inhibitor)  Chronic  medical problems: History of diabetes type 2: Home regimen Jardiance, Rybelsus, Toujeo 60 units in bedtime.  Hold home oral meds while inpatient. see above would temporarily hold Rybelsus at discharge given recent GI illness.  For basal reduced to Semglee 40 units and add SSI for moderate.  Hypertension: Not on antihypertensives Hyperlipidemia: Continue home rosuvastatin CKD 3: Baseline creatinine 1.5-1.6.  Near baseline with creatinine 1.7 on admission  Body mass index is 36.96 kg/m.  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 Yes  Diet effective now       Question Answer Comment  Diet-HS Snack? Nothing   Calorie Level Medium 1600-2000   Fluid consistency: Thin  Room service appropriate? Yes      11/11/23 0411           Family Communication:  No   Consults:  none   Admission status:   Observation, Telemetry bed  Severity of Illness: The appropriate patient status for this patient is OBSERVATION. Observation status is judged to be reasonable and necessary in order to provide the required intensity of service to ensure the patient's safety. The patient's presenting symptoms, physical exam findings, and initial radiographic and laboratory data in the context of their medical condition is felt to place them at decreased risk for further clinical deterioration. Furthermore, it is anticipated that the patient will be medically stable for discharge from the hospital within 2 midnights of admission.    Arnulfo Larch, MD [facility]  How to contact the [facility] Attending or Consulting provider 7A - 7P or covering provider during after hours 7P -7A, for this patient.  Check the care team in [facility] and look for a) attending/consulting [facility] provider listed and b) the  [facility] team listed Log into [email or web address] and use [facility]'s universal password to access. If you do not have the password, please contact the hospital operator. Locate the [facility] provider you are looking for under [facility] and page to a number that you can be directly reached. If you still have difficulty reaching the provider, please page the [facility] (Director on Call) for the Hospitalists listed on amion for assistance.  11/11/2023, 5:26 AM

## 2023-11-11 NOTE — Plan of Care (Signed)

## 2023-11-11 NOTE — ED Notes (Signed)
 Pts sister called regarding test results, referred to call pt

## 2023-11-11 NOTE — Care Plan (Signed)
 This 58 years old female with history of diabetes mellitus, hypertension, hyperlipidemia, CKD stage IIIa presented in the ED after an episode of syncope.  Patient reports last week she was transition from Ozempic to Rybelsus.  She has developed nausea which has progressed and subsequently she stopped Rybelsus.  Her blood sugar has been running high.  Yesterday she woke up with 2 episodes of vomiting.  She began to feel lightheaded in the bathroom and had a syncopal episode she fell and hit the face denies any head injury or post syncopal symptoms.  She has already felt better after getting IV fluids in the ED.  She was admitted for syncope with a ground-level fall.  Patient reports feeling much improved,  She has not been feeling nauseous or throwing up since she is admitted.  Seen and examined at bedside.

## 2023-11-11 NOTE — ED Provider Notes (Signed)
  EMERGENCY DEPARTMENT AT [facility] Provider Note   CSN: [id number] Arrival date & time: 11/10/23  2007     History Chief Complaint  Patient presents with   Fall   Nausea    Kirsten Shaffer is a 58 y.o. female hyperlipidemia, diabetes, hypertension presents to the emergency department today for evaluation of syncopal episode with nausea and vomiting today.  Patient reports that she had stood up to walk to the bathroom, felt lightheaded, and passed out.  She reports that she only feels like she was out for only a few seconds as the granddaughter was try to wake her up.  She denies any chest pain, shortness of breath or palpitations before or after the syncopal event.  She reports that she went to the bathroom without any issue then went back to bed to fall asleep.  She was awakened by frequent nausea and vomiting.  Then started develop some epigastric pain.  She reports that she was being trialed off of the Ozempic because of side effects.  She was being placed on Rybelsus for diabetes.  She reports that she has not had this in 4 days as she folic it was "paralyzing her stomach".  She also ports that during this time she resumed smoking marijuana.  She has not been able to tolerate any foods or fluids at home.  No medications taken at home.  She denies a headache or any vision changes and reports that she is legally blind.  Moving all extremities.  Denies any other injury or pain.  Fall Associated symptoms include abdominal pain. Pertinent negatives include no chest pain, no headaches and no shortness of breath.       Home Medications Prior to Admission medications   Medication Sig Start Date End Date Taking? Authorizing Provider  albuterol (PROVENTIL HFA;VENTOLIN HFA) 108 (90 BASE) MCG/ACT inhaler Inhale 1-2 puffs into the lungs every 6 (six) hours as needed for wheezing or shortness of breath. 09/04/14   Mazie Speed, MD  amLODipine (NORVASC) 10 MG tablet Take 1  tablet (10 mg total) by mouth daily. Patient not taking: No sig reported 03/12/20   Michela Aguas, MD  blood glucose meter kit and supplies KIT Dispense based on patient and insurance preference. Use up to four times daily as directed. (FOR ICD-9 250.00, 250.01). 10/09/17   Couture, Cortni S, PA-C  carvedilol (COREG) 3.125 MG tablet Take 1 tablet (3.125 mg total) by mouth 2 (two) times daily with a meal. Patient not taking: No sig reported 03/12/20   Michela Aguas, MD  HUMALOG KWIKPEN 100 UNIT/ML KwikPen Inject 1-10 Units into the skin 3 (three) times daily. Sliding scale 08/12/20   [provider]  insulin NPH Human (HUMULIN N,NOVOLIN N) 100 UNIT/ML injection Inject 2-10 Units into the skin in the morning, at noon, and at bedtime. Sliding scale    [provider]  losartan (COZAAR) 100 MG tablet Take 100 mg by mouth daily. 10/21/20   [provider]  Nutritional Supplements (,FEEDING SUPPLEMENT, PROSOURCE PLUS) liquid Take 30 mLs by mouth 2 (two) times daily between meals. Patient not taking: No sig reported 03/12/20   Michela Aguas, MD  ondansetron (ZOFRAN) 4 MG tablet Take 1 tablet (4 mg total) by mouth every 6 (six) hours as needed for nausea. Patient not taking: No sig reported 03/12/20   Michela Aguas, MD  ondansetron (ZOFRAN-ODT) 8 MG disintegrating tablet Take 1 tablet (8 mg total) by mouth 3 (three) times daily as needed. 10/27/20  Dorenda Gandy, MD  potassium chloride (KLOR-CON) 10 MEQ tablet Take 10 mEq by mouth daily. 08/28/20   [provider]  sertraline (ZOLOFT) 50 MG tablet Take 1 tablet (50 mg total) by mouth daily. Patient not taking: Reported on 10/27/2020 09/24/18 03/07/20  Cobos, Fernando A, MD  simvastatin (ZOCOR) 40 MG tablet Take 40 mg by mouth at bedtime. 09/13/17   [provider]  TOUJEO MAX SOLOSTAR 300 UNIT/ML Solostar Pen Inject 30 Units into the skin at bedtime. 02/01/20   [provider]  VITAMIN D PO Take 1  capsule by mouth daily.    [provider]      Allergies    Metformin and related    Review of Systems   Review of Systems  Constitutional:  Negative for chills and fever.  Respiratory:  Negative for shortness of breath.   Cardiovascular:  Negative for chest pain.  Gastrointestinal:  Positive for abdominal pain, nausea and vomiting.  Neurological:  Positive for syncope and light-headedness. Negative for headaches.    Physical Exam Updated Vital Signs BP (!) 150/71 (BP Location: Left Arm)   Pulse 85   Temp 98.6 F (37 C) (Oral)   Resp 15   Ht 5\' 7"  (1.702 m)   Wt 107 kg   LMP 09/05/2014   SpO2 100%   BMI 36.96 kg/m  Physical Exam Vitals and nursing note reviewed.  Constitutional:      Appearance: She is ill-appearing.     Comments: Actively vomiting in room  HENT:     Head:     Comments: Small bruising noted above the right eyebrow.  No battle signs or raccoon eyes.    Mouth/Throat:     Mouth: Mucous membranes are dry.  Eyes:     General: No scleral icterus. Cardiovascular:     Rate and Rhythm: Tachycardia present.  Pulmonary:     Effort: Pulmonary effort is normal. No respiratory distress.  Abdominal:     Palpations: Abdomen is soft.     Tenderness: There is abdominal tenderness. There is no guarding or rebound.     Comments: Epigastric to palpation.  No other tenderness.  Soft.  No guarding or rebound.  Musculoskeletal:     Cervical back: No rigidity or tenderness.  Skin:    General: Skin is warm and dry.  Neurological:     Mental Status: She is alert.     Comments: Moving all extremities     ED Results / Procedures / Treatments   Labs (all labs ordered are listed, but only abnormal results are displayed) Labs Reviewed  LIPASE, BLOOD - Abnormal; Notable for the following components:      Result Value   Lipase 56 (*)    All other components within normal limits  COMPREHENSIVE METABOLIC PANEL WITH GFR - Abnormal; Notable for the following  components:   CO2 17 (*)    Glucose, Bld 104 (*)    BUN 64 (*)    Creatinine, Ser 1.79 (*)    AST 14 (*)    GFR, Estimated 32 (*)    All other components within normal limits  CBC - Abnormal; Notable for the following components:   MCV 74.7 (*)    MCH 24.4 (*)    All other components within normal limits  CBG MONITORING, ED - Abnormal; Notable for the following components:   Glucose-Capillary 115 (*)    All other components within normal limits  RESP PANEL BY RT-PCR (RSV, FLU A&B, COVID)  [id number]  URINALYSIS, ROUTINE W REFLEX MICROSCOPIC  RAPID URINE DRUG SCREEN, HOSP PERFORMED  CBG MONITORING, ED  TROPONIN I (HIGH SENSITIVITY)  TROPONIN I (HIGH SENSITIVITY)    EKG EKG Interpretation Date/Time:  Saturday November 10 2023 20:24:04 EDT Ventricular Rate:  85 PR Interval:  131 QRS Duration:  95 QT Interval:  362 QTC Calculation: 431 R Axis:   67  Text Interpretation: Sinus rhythm Baseline wander in lead(s) II III aVF V1 V2 Confirmed by Abner Hoffman [phone]) on 11/10/2023 8:27:15 PM  Radiology CT Head Wo Contrast Result Date: 11/10/2023 CLINICAL DATA:  Initial evaluation for acute head trauma. EXAM: CT HEAD WITHOUT CONTRAST TECHNIQUE: Contiguous axial images were obtained from the base of the skull through the vertex without intravenous contrast. RADIATION DOSE REDUCTION: This exam was performed according to the departmental dose-optimization program which includes automated exposure control, adjustment of the mA and/or kV according to patient size and/or use of iterative reconstruction technique. COMPARISON:  None Available. FINDINGS: Brain: Cerebral volume within normal limits for patient age. No acute intracranial hemorrhage. No acute large vessel territory infarct. No mass lesion, midline shift, or mass effect. Ventricles are normal in size without hydrocephalus. No extra-axial fluid collection. Vascular: No abnormal hyperdense vessel. Skull: Probable small soft tissue contusion at the  right supraorbital region. Calvarium intact. 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 Globes orbital soft tissues demonstrate no acute finding. Visualized paranasal sinuses are largely clear. No significant mastoid effusion. IMPRESSION: 1. No acute intracranial abnormality. 2. Probable small soft tissue contusion at the right supraorbital region. No underlying calvarial fracture. Electronically Signed   By: Virgia Griffins M.D.   On: 11/10/2023 23:27   CT ABDOMEN PELVIS WO CONTRAST Result Date: 11/10/2023 CLINICAL DATA:  Recent syncopal episode with abdominal pain EXAM: CT ABDOMEN AND PELVIS WITHOUT CONTRAST TECHNIQUE: Multidetector CT imaging of the abdomen and pelvis was performed following the standard protocol without IV contrast. RADIATION DOSE REDUCTION: This exam was performed according to the departmental dose-optimization program which includes automated exposure control, adjustment of the mA and/or kV according to patient size and/or use of iterative reconstruction technique. COMPARISON:  12/27/2020 FINDINGS: Lower chest: No acute abnormality. Hepatobiliary: No focal liver abnormality is seen. Status post cholecystectomy. No biliary dilatation. Pancreas: Unremarkable. No pancreatic ductal dilatation or surrounding inflammatory changes. Spleen: Normal in size without focal abnormality. Adrenals/Urinary Tract: Adrenal glands are within normal limits. Kidneys are well visualize without renal calculi or obstructive change. The ureters are within normal limits. Bladder is well distended. Stomach/Bowel: The appendix is within normal limits. No obstructive or inflammatory changes of the colon are seen. Small bowel and stomach are within normal limits. Vascular/Lymphatic: No significant vascular findings are present. No enlarged abdominal or pelvic lymph nodes. Reproductive: Status post hysterectomy. No adnexal masses. Other: No abdominal wall hernia or abnormality. No  abdominopelvic ascites. Musculoskeletal: Degenerative changes of the lumbar spine are noted. IMPRESSION: No acute abnormality to correspond with the given clinical history. Electronically Signed   By: Violeta Grey M.D.   On: 11/10/2023 22:55   DG Chest Port 1 View Result Date: 11/10/2023 CLINICAL DATA:  Syncope EXAM: PORTABLE CHEST 1 VIEW COMPARISON:  07/26/2023 FINDINGS: The heart size and mediastinal contours are within normal limits. Both lungs are clear. The visualized skeletal structures are unremarkable. IMPRESSION: No active disease. Electronically Signed   By: Janeece Mechanic M.D.   On: 11/10/2023 22:09    Procedures Procedures   Medications Ordered in ED Medications  lactated ringers bolus 1,000 mL (has no administration in time range)  droperidol (INAPSINE) 2.5 MG/ML injection 1.25 mg (has no administration in time range)  ondansetron (ZOFRAN) injection 4 mg (4 mg Intravenous Given 11/10/23 2035)  lactated ringers bolus 1,000 mL (1,000 mLs Intravenous New Bag/Given 11/10/23 2208)  metoCLOPramide (REGLAN) injection 10 mg (10 mg Intravenous Given 11/10/23 2213)  diphenhydrAMINE (BENADRYL) injection 25 mg (25 mg Intravenous Given 11/10/23 2212)    ED Course/ Medical Decision Making/ A&P   Medical Decision Making Amount and/or Complexity of Data Reviewed Labs: ordered. Radiology: ordered.  Risk Prescription drug management. Decision regarding hospitalization.   58 y.o. female presents to the ER for evaluation of nausea, vomiting, syncope, and epigastric pain. Differential diagnosis includes but is not limited to CVA, ACS, arrhythmia, vasovagal / orthostatic hypotension, sepsis, hypoglycemia, electrolyte disturbance, respiratory failure, anemia, dehydration, heat injury, polypharmacy, malignancy, anxiety/panic attack, ACS/MI, Boerhaave's, DKA, elevated ICP, Ischemic bowel, Sepsis, drug-related, Appendicitis, Bowel obstruction, Electrolyte abnormalities, Pancreatitis, Biliary colic,  Gastroenteritis, Gastroparesis, Hepatitis, Migraine, Thyroid disease, Renal colic, PUD, UTI. Vital signs elevated BP, tachycardia, otherwise unremarkable. Physical exam as noted above.  Patient is actively vomiting on examination.  Peace be green, bilious emesis.  Will obtain CT abdomen pelvis given patient's epigastric pain, troponin given location however she denies chest pain shortness of breath, as well as CT of the head given that she does have small hematoma present to the right eyebrow.  I have ordered the patient Reglan, Benadryl, and fluids for her symptoms.  I independently reviewed and interpreted the patient's labs.  COVID flu RSV negative.  CBC without cytosis or anemia.  CMP does show decreased bicarb at 17 but normal elevated at 64.  Creatinine 1.79 which is elevated from patient's baseline of 1.5 AST of 14.  Glucose at 104.  Lipase is mildly elevated at 56.  Troponin unremarkable.  CT Head  1. No acute intracranial abnormality. 2. Probable small soft tissue contusion at the right supraorbital region. Per radiologist's interpretation.    CT abdomen and pelvis shows  No acute abnormality to correspond with the given clinical history. Per radiologist's interpretation.    Patient does appear to be doing better on the medication however still having some emesis.  My attending assessed at bedside.  Will give droperidol and admit due to intractable nausea vomiting and dehydration.  Dr. [facility] Lavender with [facility] hospitalist to admit.  I discussed this case with my attending physician who cosigned this note including patient's presenting symptoms, physical exam, and planned diagnostics and interventions. Attending physician stated agreement with plan or made changes to plan which were implemented.   Attending physician assessed patient at bedside.  Portions of this report may have been transcribed using voice recognition software. Every effort was made to ensure accuracy; however, inadvertent  computerized transcription errors may be present.   Final Clinical Impression(s) / ED Diagnoses Final diagnoses:  AKI (acute kidney injury) (HCC)  Intractable nausea and vomiting    Rx / DC Orders ED Discharge Orders     None         Spence Dux, PA-C 11/11/23 0304    Palumbo, April, MD 11/11/23 [phone]

## 2023-11-11 NOTE — Progress Notes (Signed)
   11/11/23 1040  TOC Brief Assessment  Insurance and Status Reviewed  Patient has primary care physician No (NO PCP listed)  Home environment has been reviewed Home  Prior level of function: Independent  Prior/Current Home Services No current home services  Social Drivers of Health Review SDOH reviewed no interventions necessary  Readmission risk has been reviewed Yes  Transition of care needs no transition of care needs at this time

## 2023-11-11 NOTE — ED Notes (Signed)
 Pt stated she has had vomiting and felt this way before in 2021 due to taking weight loss medication such as Wegovy. Pt had to stop taking it. Pt started having same symptoms after taking Rebelsus and she stopped taking it. Pt stated the Provider increased another type of weight loss medication she is taking recently and now the same symptoms are back again.

## 2023-11-11 NOTE — Progress Notes (Signed)
 Patient is transferred from [facility] to [facility] at 0140. Alert and oriented x 4. No pain is complained at this time. No symptoms of N/V. Vital signs was taken and paged MD admission on call at the same time. Waiting for new orders.

## 2023-11-11 NOTE — Plan of Care (Signed)
  Problem: Education: Goal: Knowledge of General Education information will improve Description: Including pain rating scale, medication(s)/side effects and non-pharmacologic comfort measures Outcome: Progressing   Problem: Clinical Measurements: Goal: Diagnostic test results will improve Outcome: Progressing   Problem: Activity: Goal: Risk for activity intolerance will decrease Outcome: Progressing   Problem: Nutrition: Goal: Adequate nutrition will be maintained Outcome: Progressing   Problem: Pain Managment: Goal: General experience of comfort will improve and/or be controlled Outcome: Progressing

## 2023-11-11 NOTE — Care Management Obs Status (Signed)
 MEDICARE OBSERVATION STATUS NOTIFICATION   Patient Details  Name: Kirsten Shaffer MRN: [id number] Date of Birth: 10/27/1965   Medicare Observation Status Notification Given:  Yes    Joshuajames Moehring Liane Redman, LCSW 11/11/2023, 10:49 AM

## 2023-11-12 DIAGNOSIS — R112 Nausea with vomiting, unspecified: Secondary | ICD-10-CM | POA: Diagnosis not present

## 2023-11-12 LAB — HEMOGLOBIN A1C
Hgb A1c MFr Bld: 11.6 % — ABNORMAL HIGH (ref 4.8–5.6)
Mean Plasma Glucose: 286 mg/dL

## 2023-11-12 LAB — GLUCOSE, CAPILLARY
Glucose-Capillary: 159 mg/dL — ABNORMAL HIGH (ref 70–99)
Glucose-Capillary: 49 mg/dL — ABNORMAL LOW (ref 70–99)
Glucose-Capillary: 70 mg/dL (ref 70–99)
Glucose-Capillary: 187 mg/dL — ABNORMAL HIGH (ref 70–99)

## 2023-11-12 LAB — CBC
HCT: 33.1 % — ABNORMAL LOW (ref 36.0–46.0)
Hemoglobin: 10.3 g/dL — ABNORMAL LOW (ref 12.0–15.0)
MCH: 24.5 pg — ABNORMAL LOW (ref 26.0–34.0)
MCHC: 31.1 g/dL (ref 30.0–36.0)
MCV: 78.6 fL — ABNORMAL LOW (ref 80.0–100.0)
Platelets: 199 10*3/uL (ref 150–400)
RBC: 4.21 MIL/uL (ref 3.87–5.11)
RDW: 15.9 % — ABNORMAL HIGH (ref 11.5–15.5)
WBC: 8.1 10*3/uL (ref 4.0–10.5)
nRBC: 0 % (ref 0.0–0.2)

## 2023-11-12 LAB — BASIC METABOLIC PANEL WITH GFR
Anion gap: 6 (ref 5–15)
BUN: 44 mg/dL — ABNORMAL HIGH (ref 6–20)
CO2: 22 mmol/L (ref 22–32)
Calcium: 9 mg/dL (ref 8.9–10.3)
Chloride: 110 mmol/L (ref 98–111)
Creatinine, Ser: 1.32 mg/dL — ABNORMAL HIGH (ref 0.44–1.00)
GFR, Estimated: 47 mL/min — ABNORMAL LOW (ref >60)
Glucose, Bld: 111 mg/dL — ABNORMAL HIGH (ref 70–99)
Potassium: 4.5 mmol/L (ref 3.5–5.1)
Sodium: 138 mmol/L (ref 135–145)

## 2023-11-12 MED ORDER — DEXTROSE 50 % IV SOLN
25.0000 g | INTRAVENOUS | Status: AC
  Administered 2023-11-12: 25 g via INTRAVENOUS
  Filled 2023-11-12: qty 50

## 2023-11-12 NOTE — Progress Notes (Signed)
 Hypoglycemic Event  CBG: 49   Treatment: D50 50 mL (25 gm)  Symptoms: None  Follow-up CBG: Time:0321 CBG Result:159  Possible Reasons for Event: Unknown  Comments/MD notified:Daniels NP notified and aware    Latalia Etzler Emmit Harold BSN RN

## 2023-11-12 NOTE — Plan of Care (Signed)
 Problem: Education: Goal: Knowledge of General Education information will improve Description: Including pain rating scale, medication(s)/side effects and non-pharmacologic comfort measures 11/12/2023 1433 by Helen Loa, RN Outcome: Adequate for Discharge 11/12/2023 0820 by Helen Loa, RN Outcome: Progressing   Problem: Health Behavior/Discharge Planning: Goal: Ability to manage health-related needs will improve 11/12/2023 1433 by Helen Loa, RN Outcome: Adequate for Discharge 11/12/2023 0820 by Helen Loa, RN Outcome: Progressing   Problem: Clinical Measurements: Goal: Ability to maintain clinical measurements within normal limits will improve 11/12/2023 1433 by Helen Loa, RN Outcome: Adequate for Discharge 11/12/2023 0820 by Helen Loa, RN Outcome: Progressing Goal: Will remain free from infection 11/12/2023 1433 by Helen Loa, RN Outcome: Adequate for Discharge 11/12/2023 0820 by Helen Loa, RN Outcome: Progressing Goal: Diagnostic test results will improve 11/12/2023 1433 by Helen Loa, RN Outcome: Adequate for Discharge 11/12/2023 0820 by Helen Loa, RN Outcome: Progressing Goal: Respiratory complications will improve 11/12/2023 1433 by Helen Loa, RN Outcome: Adequate for Discharge 11/12/2023 0820 by Helen Loa, RN Outcome: Progressing Goal: Cardiovascular complication will be avoided 11/12/2023 1433 by Helen Loa, RN Outcome: Adequate for Discharge 11/12/2023 0820 by Helen Loa, RN Outcome: Progressing   Problem: Activity: Goal: Risk for activity intolerance will decrease 11/12/2023 1433 by Helen Loa, RN Outcome: Adequate for Discharge 11/12/2023 0820 by Helen Loa, RN Outcome: Progressing   Problem: Nutrition: Goal: Adequate nutrition will be maintained 11/12/2023 1433 by Helen Loa, RN Outcome: Adequate for Discharge 11/12/2023 0820 by Helen Loa, RN Outcome: Progressing   Problem: Coping: Goal: Level of anxiety will  decrease 11/12/2023 1433 by Helen Loa, RN Outcome: Adequate for Discharge 11/12/2023 0820 by Helen Loa, RN Outcome: Progressing   Problem: Elimination: Goal: Will not experience complications related to bowel motility 11/12/2023 1433 by Helen Loa, RN Outcome: Adequate for Discharge 11/12/2023 0820 by Helen Loa, RN Outcome: Progressing Goal: Will not experience complications related to urinary retention 11/12/2023 1433 by Helen Loa, RN Outcome: Adequate for Discharge 11/12/2023 0820 by Helen Loa, RN Outcome: Progressing   Problem: Pain Managment: Goal: General experience of comfort will improve and/or be controlled 11/12/2023 1433 by Helen Loa, RN Outcome: Adequate for Discharge 11/12/2023 0820 by Helen Loa, RN Outcome: Progressing   Problem: Safety: Goal: Ability to remain free from injury will improve 11/12/2023 1433 by Helen Loa, RN Outcome: Adequate for Discharge 11/12/2023 0820 by Helen Loa, RN Outcome: Progressing   Problem: Skin Integrity: Goal: Risk for impaired skin integrity will decrease 11/12/2023 1433 by Helen Loa, RN Outcome: Adequate for Discharge 11/12/2023 0820 by Helen Loa, RN Outcome: Progressing   Problem: Education: Goal: Ability to describe self-care measures that may prevent or decrease complications (Diabetes Survival Skills Education) will improve 11/12/2023 1433 by Helen Loa, RN Outcome: Adequate for Discharge 11/12/2023 0820 by Helen Loa, RN Outcome: Progressing Goal: Individualized Educational Video(s) 11/12/2023 1433 by Helen Loa, RN Outcome: Adequate for Discharge 11/12/2023 0820 by Helen Loa, RN Outcome: Progressing   Problem: Coping: Goal: Ability to adjust to condition or change in health will improve 11/12/2023 1433 by Helen Loa, RN Outcome: Adequate for Discharge 11/12/2023 0820 by Helen Loa, RN Outcome: Progressing   Problem: Fluid Volume: Goal: Ability to maintain a balanced intake and  output will improve 11/12/2023 1433 by Helen Loa, RN Outcome: Adequate for Discharge 11/12/2023 0820 by Helen Loa, RN Outcome: Progressing   Problem: Health Behavior/Discharge Planning: Goal: Ability to identify and utilize available resources and services will improve 11/12/2023 1433 by Helen Loa, RN Outcome: Adequate for Discharge 11/12/2023 0820  by Helen Loa, RN Outcome: Progressing Goal: Ability to manage health-related needs will improve 11/12/2023 1433 by Helen Loa, RN Outcome: Adequate for Discharge 11/12/2023 0820 by Helen Loa, RN Outcome: Progressing   Problem: Metabolic: Goal: Ability to maintain appropriate glucose levels will improve 11/12/2023 1433 by Helen Loa, RN Outcome: Adequate for Discharge 11/12/2023 0820 by Helen Loa, RN Outcome: Progressing   Problem: Nutritional: Goal: Maintenance of adequate nutrition will improve 11/12/2023 1433 by Helen Loa, RN Outcome: Adequate for Discharge 11/12/2023 0820 by Helen Loa, RN Outcome: Progressing Goal: Progress toward achieving an optimal weight will improve 11/12/2023 1433 by Helen Loa, RN Outcome: Adequate for Discharge 11/12/2023 0820 by Helen Loa, RN Outcome: Progressing   Problem: Skin Integrity: Goal: Risk for impaired skin integrity will decrease 11/12/2023 1433 by Helen Loa, RN Outcome: Adequate for Discharge 11/12/2023 0820 by Helen Loa, RN Outcome: Progressing   Problem: Tissue Perfusion: Goal: Adequacy of tissue perfusion will improve 11/12/2023 1433 by Helen Loa, RN Outcome: Adequate for Discharge 11/12/2023 0820 by Helen Loa, RN Outcome: Progressing

## 2023-11-12 NOTE — Discharge Summary (Signed)
 Physician Discharge Summary  Kirsten Shaffer ZOX:[id number] DOB: [birth year] DOA: 11/10/2023  PCP: Pcp, No  Admit date: 11/10/2023  Discharge date: 11/12/2023  Admitted From: Home  Disposition:  Home  Recommendations for Outpatient Follow-up:  Follow up with PCP in 1-2 weeks. Please obtain BMP/CBC in one week. Advised to hold Rybelsus and Ozempic until follow-up with PCP.  Home Health: None Equipment/Devices: None  Discharge Condition: Stable CODE STATUS:Full code Diet recommendation: Heart Healthy   Brief [facility] Course: This 58 years old female with history of diabetes mellitus, hypertension, hyperlipidemia, CKD stage IIIa presented in the ED after an episode of syncope.  Patient reports last week she was transitioned from Ozempic to Rybelsus.  She has developed nausea which has progressed and subsequently she stopped Rybelsus.  Her blood sugar has been running high.  Yesterday she woke up with 2 episodes of vomiting.  She began to feel lightheaded in the bathroom and had a syncopal episode she fell and hit the face,  denies any head injury or post syncopal symptoms.  She has already felt better after getting IV fluids in the ED.  She was admitted for syncope with a ground-level fall.  Patient reports feeling much improved,  She has not been feeling nauseous or throwing up since she is admitted.  Patient was given IV fluids.  She has tolerated clear liquid diet,  advanced to soft diet and she tolerated well.  She feels improved and wants to be discharged Home. She denies any further dizziness.  Patient is being discharged home.  Discharge Diagnoses:  Principal Problem:   Nausea & vomiting Active Problems:   Syncope, vasovagal   Increased anion gap metabolic acidosis  Discharge Instructions  Discharge Instructions     Call MD for:  difficulty breathing, headache or visual disturbances   Complete by: As directed    Call MD for:  persistant dizziness or light-headedness    Complete by: As directed    Call MD for:  persistant nausea and vomiting   Complete by: As directed    Diet - low sodium heart healthy   Complete by: As directed    Diet Carb Modified   Complete by: As directed    Discharge instructions   Complete by: As directed    Advised to follow up PCP in  one week. Advised to hold Rybelsus and Ozempic until follow-up with PCP.   Increase activity slowly   Complete by: As directed       Allergies as of 11/12/2023   No Known Allergies      Medication List     STOP taking these medications    Ozempic (0.25 or 0.5 MG/DOSE) 2 MG/3ML Sopn Generic drug: Semaglutide(0.25 or 0.5MG /DOS)   Rybelsus 3 MG Tabs Generic drug: Semaglutide       TAKE these medications    Jardiance 10 MG Tabs tablet Generic drug: empagliflozin Take 10 mg by mouth daily. What changed: Another medication with the same name was removed. Continue taking this medication, and follow the directions you see here.   losartan 100 MG tablet Commonly known as: COZAAR Take 100 mg by mouth daily.   rosuvastatin 10 MG tablet Commonly known as: CRESTOR Take 10 mg by mouth at bedtime.   Toujeo Max SoloStar 300 UNIT/ML Solostar Pen Generic drug: insulin glargine (2 Unit Dial) Inject 60 Units into the skin at bedtime.        Follow-up Information     Primary Care at [facility] Follow  up in 1 week(s).   Specialty: Radiology Contact information: [address] [place] [address] [phone]               No Known Allergies  Consultations: None   Procedures/Studies: CT Head Wo Contrast Result Date: 11/10/2023 CLINICAL DATA:  Initial evaluation for acute head trauma. EXAM: CT HEAD WITHOUT CONTRAST TECHNIQUE: Contiguous axial images were obtained from the base of the skull through the vertex without intravenous contrast. RADIATION DOSE REDUCTION: This exam was performed according to the departmental dose-optimization program which  includes automated exposure control, adjustment of the mA and/or kV according to patient size and/or use of iterative reconstruction technique. COMPARISON:  None Available. FINDINGS: Brain: Cerebral volume within normal limits for patient age. No acute intracranial hemorrhage. No acute large vessel territory infarct. No mass lesion, midline shift, or mass effect. Ventricles are normal in size without hydrocephalus. No extra-axial fluid collection. Vascular: No abnormal hyperdense vessel. Skull: Probable small soft tissue contusion at the right supraorbital region. Calvarium intact. 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 Globes orbital soft tissues demonstrate no acute finding. Visualized paranasal sinuses are largely clear. No significant mastoid effusion. IMPRESSION: 1. No acute intracranial abnormality. 2. Probable small soft tissue contusion at the right supraorbital region. No underlying calvarial fracture. Electronically Signed   By: Rise Mu M.D.   On: 11/10/2023 23:27   CT ABDOMEN PELVIS WO CONTRAST Result Date: 11/10/2023 CLINICAL DATA:  Recent syncopal episode with abdominal pain EXAM: CT ABDOMEN AND PELVIS WITHOUT CONTRAST TECHNIQUE: Multidetector CT imaging of the abdomen and pelvis was performed following the standard protocol without IV contrast. RADIATION DOSE REDUCTION: This exam was performed according to the departmental dose-optimization program which includes automated exposure control, adjustment of the mA and/or kV according to patient size and/or use of iterative reconstruction technique. COMPARISON:  12/27/2020 FINDINGS: Lower chest: No acute abnormality. Hepatobiliary: No focal liver abnormality is seen. Status post cholecystectomy. No biliary dilatation. Pancreas: Unremarkable. No pancreatic ductal dilatation or surrounding inflammatory changes. Spleen: Normal in size without focal abnormality. Adrenals/Urinary Tract: Adrenal glands are  within normal limits. Kidneys are well visualize without renal calculi or obstructive change. The ureters are within normal limits. Bladder is well distended. Stomach/Bowel: The appendix is within normal limits. No obstructive or inflammatory changes of the colon are seen. Small bowel and stomach are within normal limits. Vascular/Lymphatic: No significant vascular findings are present. No enlarged abdominal or pelvic lymph nodes. Reproductive: Status post hysterectomy. No adnexal masses. Other: No abdominal wall hernia or abnormality. No abdominopelvic ascites. Musculoskeletal: Degenerative changes of the lumbar spine are noted. IMPRESSION: No acute abnormality to correspond with the given clinical history. Electronically Signed   By: Alcide Clever M.D.   On: 11/10/2023 22:55   DG Chest Port 1 View Result Date: 11/10/2023 CLINICAL DATA:  Syncope EXAM: PORTABLE CHEST 1 VIEW COMPARISON:  07/26/2023 FINDINGS: The heart size and mediastinal contours are within normal limits. Both lungs are clear. The visualized skeletal structures are unremarkable. IMPRESSION: No active disease. Electronically Signed   By: Charlett Nose M.D.   On: 11/10/2023 22:09    Subjective: Patient was seen and examined at bedside. Overnight events noted.   Patient reports feeling much improved , tolerating soft diet .She wants to be discharged home.  Discharge Exam: Vitals:   11/12/23 0426 11/12/23 1136  BP: (!) 140/61 (!) 136/56  Pulse: 80 87  Resp: 18   Temp: 98.8 F (37.1 C) 98.3 F (  36.8 C)  SpO2: 99% 100%   Vitals:   11/11/23 0649 11/11/23 2007 11/12/23 0426 11/12/23 1136  BP: (!) 155/63 (!) 134/57 (!) 140/61 (!) 136/56  Pulse: 89 91 80 87  Resp: 16 17 18    Temp: 98.5 F (36.9 C) 99.3 F (37.4 C) 98.8 F (37.1 C) 98.3 F (36.8 C)  TempSrc:  Oral Oral Oral  SpO2: 94% 100% 99% 100%  Weight:      Height:        General: Pt is alert, awake, not in acute distress Cardiovascular: RRR, S1/S2 +, no rubs, no  gallops Respiratory: CTA bilaterally, no wheezing, no rhonchi Abdominal: Soft, NT, ND, bowel sounds + Extremities: no edema, no cyanosis    The results of significant diagnostics from this hospitalization (including imaging, microbiology, ancillary and laboratory) are listed below for reference.     Microbiology: Recent Results (from the past 240 hours)  Resp panel by RT-PCR (RSV, Flu A&B, Covid) Anterior Nasal Swab     Status: None   Collection Time: 11/10/23  8:45 PM   Specimen: Anterior Nasal Swab  Result Value Ref Range Status   SARS Coronavirus 2 by RT PCR NEGATIVE NEGATIVE Final    Comment: (NOTE) SARS-CoV-2 target nucleic acids are NOT DETECTED.  The SARS-CoV-2 RNA is generally detectable in upper respiratory specimens during the acute phase of infection. The lowest concentration of SARS-CoV-2 viral copies this assay can detect is 138 copies/mL. A negative result does not preclude SARS-Cov-2 infection and should not be used as the sole basis for treatment or other patient management decisions. A negative result may occur with  improper specimen collection/handling, submission of specimen other than nasopharyngeal swab, presence of viral mutation(s) within the areas targeted by this assay, and inadequate number of viral copies(<138 copies/mL). A negative result must be combined with clinical observations, patient history, and epidemiological information. The expected result is Negative.  Fact Sheet for Patients:  BloggerCourse.com  Fact Sheet for Healthcare Providers:  SeriousBroker.it  This test is no t yet approved or cleared by the [place] FDA and  has been authorized for detection and/or diagnosis of SARS-CoV-2 by FDA under an Emergency Use Authorization (EUA). This EUA will remain  in effect (meaning this test can be used) for the duration of the COVID-19 declaration under Section 564(b)(1) of the Act,  21 U.S.C.section 360bbb-3(b)(1), unless the authorization is terminated  or revoked sooner.       Influenza A by PCR NEGATIVE NEGATIVE Final   Influenza B by PCR NEGATIVE NEGATIVE Final    Comment: (NOTE) The Xpert Xpress SARS-CoV-2/FLU/RSV plus assay is intended as an aid in the diagnosis of influenza from Nasopharyngeal swab specimens and should not be used as a sole basis for treatment. Nasal washings and aspirates are unacceptable for Xpert Xpress SARS-CoV-2/FLU/RSV testing.  Fact Sheet for Patients: BloggerCourse.com  Fact Sheet for Healthcare Providers: SeriousBroker.it  This test is not yet approved or cleared by the [place] FDA and has been authorized for detection and/or diagnosis of SARS-CoV-2 by FDA under an Emergency Use Authorization (EUA). This EUA will remain in effect (meaning this test can be used) for the duration of the COVID-19 declaration under Section 564(b)(1) of the Act, 21 U.S.C. section 360bbb-3(b)(1), unless the authorization is terminated or revoked.     Resp Syncytial Virus by PCR NEGATIVE NEGATIVE Final    Comment: (NOTE) Fact Sheet for Patients: BloggerCourse.com  Fact Sheet for Healthcare Providers: SeriousBroker.it  This test is not yet  approved or cleared by the [place]  FDA and has been authorized for detection and/or diagnosis of SARS-CoV-2 by FDA under an Emergency Use Authorization (EUA). This EUA will remain in effect (meaning this test can be used) for the duration of the COVID-19 declaration under Section 564(b)(1) of the Act, 21 U.S.C. section 360bbb-3(b)(1), unless the authorization is terminated or revoked.  Performed at [facility], [address] [place], [place] [address]      Labs: BNP (last 3 results) No results for input(s): "BNP" in the last 8760 hours. Basic Metabolic Panel: Recent  Labs  Lab 11/10/23 2055 11/11/23 0800 11/12/23 0534  NA 141 140 138  K 4.4 3.7 4.5  CL 111 113* 110  CO2 17* 20* 22  GLUCOSE 104* 53* 111*  BUN 64* 59* 44*  CREATININE 1.79* 1.56* 1.32*  CALCIUM 9.8 9.0 9.0  MG  --  2.2  --   PHOS  --  3.1  --    Liver Function Tests: Recent Labs  Lab 11/10/23 2055  AST 14*  ALT 11  ALKPHOS 125  BILITOT 0.5  PROT 8.0  ALBUMIN 4.2   Recent Labs  Lab 11/10/23 2055  LIPASE 56*   No results for input(s): "AMMONIA" in the last 168 hours. CBC: Recent Labs  Lab 11/10/23 2055 11/11/23 0800 11/12/23 0534  WBC 9.7 11.0* 8.1  HGB 12.3 10.8* 10.3*  HCT 37.7 35.5* 33.1*  MCV 74.7* 79.6* 78.6*  PLT 235 207 199   Cardiac Enzymes: No results for input(s): "CKTOTAL", "CKMB", "CKMBINDEX", "TROPONINI" in the last 168 hours. BNP: Invalid input(s): "POCBNP" CBG: Recent Labs  Lab 11/11/23 2138 11/12/23 0257 11/12/23 0321 11/12/23 0754 11/12/23 1134  GLUCAP 117* 49* 159* 70 187*   D-Dimer No results for input(s): "DDIMER" in the last 72 hours. Hgb A1c Recent Labs    11/11/23 0800  HGBA1C 11.6*   Lipid Profile No results for input(s): "CHOL", "HDL", "LDLCALC", "TRIG", "CHOLHDL", "LDLDIRECT" in the last 72 hours. Thyroid function studies No results for input(s): "TSH", "T4TOTAL", "T3FREE", "THYROIDAB" in the last 72 hours.  Invalid input(s): "FREET3" Anemia work up No results for input(s): "VITAMINB12", "FOLATE", "FERRITIN", "TIBC", "IRON", "RETICCTPCT" in the last 72 hours. Urinalysis    Component Value Date/Time   COLORURINE YELLOW 11/11/2023 0253   APPEARANCEUR HAZY (A) 11/11/2023 0253   LABSPEC 1.008 11/11/2023 0253   PHURINE 5.0 11/11/2023 0253   GLUCOSEU >=500 (A) 11/11/2023 0253   HGBUR NEGATIVE 11/11/2023 0253   BILIRUBINUR NEGATIVE 11/11/2023 0253   KETONESUR NEGATIVE 11/11/2023 0253   PROTEINUR NEGATIVE 11/11/2023 0253   UROBILINOGEN 0.2 11/09/2013 2119   NITRITE NEGATIVE 11/11/2023 0253   LEUKOCYTESUR TRACE  (A) 11/11/2023 0253   Sepsis Labs Recent Labs  Lab 11/10/23 2055 11/11/23 0800 11/12/23 0534  WBC 9.7 11.0* 8.1   Microbiology Recent Results (from the past 240 hours)  Resp panel by RT-PCR (RSV, Flu A&B, Covid) Anterior Nasal Swab     Status: None   Collection Time: 11/10/23  8:45 PM   Specimen: Anterior Nasal Swab  Result Value Ref Range Status   SARS Coronavirus 2 by RT PCR NEGATIVE NEGATIVE Final    Comment: (NOTE) SARS-CoV-2 target nucleic acids are NOT DETECTED.  The SARS-CoV-2 RNA is generally detectable in upper respiratory specimens during the acute phase of infection. The lowest concentration of SARS-CoV-2 viral copies this assay can detect is 138 copies/mL. A negative result does not preclude SARS-Cov-2 infection and should not be used as the sole  basis for treatment or other patient management decisions. A negative result may occur with  improper specimen collection/handling, submission of specimen other than nasopharyngeal swab, presence of viral mutation(s) within the areas targeted by this assay, and inadequate number of viral copies(<138 copies/mL). A negative result must be combined with clinical observations, patient history, and epidemiological information. The expected result is Negative.  Fact Sheet for Patients:  BloggerCourse.com  Fact Sheet for Healthcare Providers:  SeriousBroker.it  This test is no t yet approved or cleared by the [place]  FDA and  has been authorized for detection and/or diagnosis of SARS-CoV-2 by FDA under an Emergency Use Authorization (EUA). This EUA will remain  in effect (meaning this test can be used) for the duration of the COVID-19 declaration under Section 564(b)(1) of the Act, 21 U.S.C.section 360bbb-3(b)(1), unless the authorization is terminated  or revoked sooner.       Influenza A by PCR NEGATIVE NEGATIVE Final   Influenza B by PCR NEGATIVE NEGATIVE Final     Comment: (NOTE) The Xpert Xpress SARS-CoV-2/FLU/RSV plus assay is intended as an aid in the diagnosis of influenza from Nasopharyngeal swab specimens and should not be used as a sole basis for treatment. Nasal washings and aspirates are unacceptable for Xpert Xpress SARS-CoV-2/FLU/RSV testing.  Fact Sheet for Patients: BloggerCourse.com  Fact Sheet for Healthcare Providers: SeriousBroker.it  This test is not yet approved or cleared by the [place]  FDA and has been authorized for detection and/or diagnosis of SARS-CoV-2 by FDA under an Emergency Use Authorization (EUA). This EUA will remain in effect (meaning this test can be used) for the duration of the COVID-19 declaration under Section 564(b)(1) of the Act, 21 U.S.C. section 360bbb-3(b)(1), unless the authorization is terminated or revoked.     Resp Syncytial Virus by PCR NEGATIVE NEGATIVE Final    Comment: (NOTE) Fact Sheet for Patients: BloggerCourse.com  Fact Sheet for Healthcare Providers: SeriousBroker.it  This test is not yet approved or cleared by the [place]  FDA and has been authorized for detection and/or diagnosis of SARS-CoV-2 by FDA under an Emergency Use Authorization (EUA). This EUA will remain in effect (meaning this test can be used) for the duration of the COVID-19 declaration under Section 564(b)(1) of the Act, 21 U.S.C. section 360bbb-3(b)(1), unless the authorization is terminated or revoked.  Performed at [facility], [address] [place], [place] [address]      Time coordinating discharge: Over 30 minutes  SIGNED:   Magdalene School, MD  [facility] 11/12/2023, 3:03 PM Pager   If 7PM-7AM, please contact night-coverage

## 2023-11-12 NOTE — Plan of Care (Signed)
   Problem: Education: Goal: Knowledge of General Education information will improve Description Including pain rating scale, medication(s)/side effects and non-pharmacologic comfort measures Outcome: Progressing   Problem: Clinical Measurements: Goal: Ability to maintain clinical measurements within normal limits will improve Outcome: Progressing   Problem: Clinical Measurements: Goal: Will remain free from infection Outcome: Progressing

## 2023-11-12 NOTE — Inpatient Diabetes Management (Signed)
 Inpatient Diabetes Program Recommendations  AACE/ADA: New Consensus Statement on Inpatient Glycemic Control (2015)  Target Ranges:  Prepandial:   less than 140 mg/dL      Peak postprandial:   less than 180 mg/dL (1-2 hours)      Critically ill patients:  140 - 180 mg/dL   Lab Results  Component Value Date   GLUCAP 187 (H) 11/12/2023   HGBA1C 11.6 (H) 11/11/2023    Review of Glycemic Control  Diabetes history: DM2 Outpatient Diabetes medications: Toujeo 60 units at bedtime, Jardiance 10 mg daily, increasing to 25 mg daily. Ozempic and Rybelsus d/ced Current orders for Inpatient glycemic control: Semglee 40 daily, Novolog 0-15 TID  HgbA1C - 11.6%  Inpatient Diabetes Program Recommendations:    Spoke with pt at bedside regarding her diabetes and HgbA1C of 11.6%. Pt states she has Dexcom CGM and will f/u with her PCP @ [place]. States she has spoken to PCP regarding d/cing Ozempic, Rybelsus and increasing Jardiance to 25 mg daily. Discussed A1C results (11.6% on 11/11/23 ) and explained that current A1C indicates an average glucose of 286 mg/dl over the past 2-3 months. Discussed glucose and A1C goals. Discussed importance of checking CBGs and maintaining good CBG control to prevent long-term and short-term complications. Explained how hyperglycemia leads to damage within blood vessels which lead to the common complications seen with uncontrolled diabetes. Stressed to the patient the importance of improving glycemic control to prevent further complications from uncontrolled diabetes. Discussed impact of nutrition, exercise, stress, sickness, and medications on diabetes control.   Pt seems motivated to make lifestyle changes with diet and exercise.   Thank you. Joni Net, RD, LDN, CDC[facility] Inpatient Diabetes Coordinator [phone]

## 2023-11-12 NOTE — Discharge Instructions (Signed)
 Advised to follow up PCP in  one week. Advised to hold Rybelsus and Ozempic until follow-up with PCP.

## 2024-06-27 ENCOUNTER — Encounter (HOSPITAL_BASED_OUTPATIENT_CLINIC_OR_DEPARTMENT_OTHER): Payer: Self-pay

## 2024-06-27 ENCOUNTER — Other Ambulatory Visit: Payer: Self-pay

## 2024-06-27 ENCOUNTER — Emergency Department (HOSPITAL_BASED_OUTPATIENT_CLINIC_OR_DEPARTMENT_OTHER): Admitting: Radiology

## 2024-06-27 ENCOUNTER — Emergency Department (HOSPITAL_BASED_OUTPATIENT_CLINIC_OR_DEPARTMENT_OTHER)

## 2024-06-27 ENCOUNTER — Emergency Department (HOSPITAL_BASED_OUTPATIENT_CLINIC_OR_DEPARTMENT_OTHER)
Admission: EM | Admit: 2024-06-27 | Discharge: 2024-06-27 | Disposition: A | Discharge status: Home / Self Care | Attending: Emergency Medicine | Admitting: Emergency Medicine

## 2024-06-27 DIAGNOSIS — M79642 Pain in left hand: Secondary | ICD-10-CM | POA: Insufficient documentation

## 2024-06-27 DIAGNOSIS — W01198A Fall on same level from slipping, tripping and stumbling with subsequent striking against other object, initial encounter: Secondary | ICD-10-CM | POA: Diagnosis not present

## 2024-06-27 DIAGNOSIS — Z794 Long term (current) use of insulin: Secondary | ICD-10-CM | POA: Insufficient documentation

## 2024-06-27 DIAGNOSIS — Z79899 Other long term (current) drug therapy: Secondary | ICD-10-CM | POA: Insufficient documentation

## 2024-06-27 DIAGNOSIS — Z7984 Long term (current) use of oral hypoglycemic drugs: Secondary | ICD-10-CM | POA: Diagnosis not present

## 2024-06-27 DIAGNOSIS — Z23 Encounter for immunization: Secondary | ICD-10-CM | POA: Diagnosis not present

## 2024-06-27 DIAGNOSIS — M79641 Pain in right hand: Secondary | ICD-10-CM | POA: Diagnosis not present

## 2024-06-27 DIAGNOSIS — S0083XA Contusion of other part of head, initial encounter: Secondary | ICD-10-CM | POA: Insufficient documentation

## 2024-06-27 DIAGNOSIS — S0990XA Unspecified injury of head, initial encounter: Secondary | ICD-10-CM | POA: Diagnosis present

## 2024-06-27 DIAGNOSIS — N1831 Chronic kidney disease, stage 3a: Secondary | ICD-10-CM | POA: Diagnosis not present

## 2024-06-27 DIAGNOSIS — E1122 Type 2 diabetes mellitus with diabetic chronic kidney disease: Secondary | ICD-10-CM | POA: Insufficient documentation

## 2024-06-27 DIAGNOSIS — I129 Hypertensive chronic kidney disease with stage 1 through stage 4 chronic kidney disease, or unspecified chronic kidney disease: Secondary | ICD-10-CM | POA: Diagnosis not present

## 2024-06-27 DIAGNOSIS — W19XXXA Unspecified fall, initial encounter: Secondary | ICD-10-CM

## 2024-06-27 MED ORDER — ACETAMINOPHEN ER 650 MG PO TBCR: 650.0000 mg | EXTENDED_RELEASE_TABLET | Freq: Three times a day (TID) | ORAL | 0 refills | Status: AC | PRN

## 2024-06-27 MED ORDER — DICLOFENAC SODIUM 1 % EX GEL: 2.0000 g | Freq: Four times a day (QID) | CUTANEOUS | 0 refills | Status: AC

## 2024-06-27 MED ORDER — TETANUS-DIPHTH-ACELL PERTUSSIS 5-2-15.5 LF-MCG/0.5 IM SUSP
0.5000 mL | Freq: Once | INTRAMUSCULAR | Status: AC
  Administered 2024-06-27: 0.5 mL via INTRAMUSCULAR
  Filled 2024-06-27: qty 0.5

## 2024-06-27 MED ORDER — OXYCODONE-ACETAMINOPHEN 5-325 MG PO TABS
1.0000 | ORAL_TABLET | ORAL | Status: DC | PRN
  Administered 2024-06-27: 1 via ORAL
  Filled 2024-06-27: qty 1

## 2024-06-27 NOTE — ED Provider Notes (Signed)
 [facility] AT [facility] Provider Note   CSN: [id number] Arrival date & time: 06/27/24  2138     Patient presents with: [facility] Kirsten Shaffer is a 58 y.o. female with past medical history of T2DM, CKD stage IIIa, HTN presents Emergency Department for evaluation of head injury, bilateral hand pain following a fall today.  She reports that she was going to the front door to make sure that it was locked when she tripped over her dog and her head hit the hinge of the door.  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  No LOC but reports feeling mildly nauseous following incident.  Patient presents with her sister who denies AMS, lethargy, repetitive questioning, agitation, vomiting following incident reported mentation has been at baseline since she went to pick her up.  Patient denies thinners, complaints prior to injury     Fall       Prior to Admission medications   Medication Sig Start Date End Date Taking? Authorizing Provider  acetaminophen  (TYLENOL  8 HOUR) 650 MG CR tablet Take 1 tablet (650 mg total) by mouth every 8 (eight) hours as needed for up to 15 days for pain. 06/27/24 07/12/24 Yes Minnie Tinnie BRAVO, PA  diclofenac  Sodium (VOLTAREN ) 1 % GEL Apply 2 g topically 4 (four) times daily. 06/27/24  Yes Minnie, Aniko Finnigan E, PA  JARDIANCE 10 MG TABS tablet Take 10 mg by mouth daily. 12/12/21   [provider]  losartan (COZAAR) 100 MG tablet Take 100 mg by mouth daily. 10/21/20   [provider]  rosuvastatin  (CRESTOR ) 10 MG tablet Take 10 mg by mouth at bedtime.    [provider]  TOUJEO  MAX SOLOSTAR 300 UNIT/ML Solostar Pen Inject 60 Units into the skin at bedtime. 02/01/20   [provider]    Allergies: Patient has no known allergies.    Review of Systems  Musculoskeletal:  Negative for back pain and neck pain.    Updated Vital Signs BP (!) 161/92    Pulse 87   Temp 97.7 F (36.5 C)   Resp 18   Ht 5' 7 (1.702 m)   Wt 106.6 kg   LMP 09/05/2014   SpO2 100%   BMI 36.81 kg/m   Physical Exam Vitals and nursing note reviewed.  Constitutional:      General: She is not in acute distress.    Appearance: Normal appearance. She is not diaphoretic.  HENT:     Head: Normocephalic and atraumatic.     Comments: Hematoma to right forehead. Skin tear to right forehead. No active hemorrhage. No crepitus to facial bones    Right Ear: External ear normal. No hemotympanum.     Left Ear: External ear normal. No hemotympanum.     Nose: Nose normal.     Right Nostril: No epistaxis or septal hematoma.     Left Nostril: No epistaxis or septal hematoma.     Mouth/Throat:     Mouth: Mucous membranes are moist. No injury or lacerations.  Eyes:     General: Lids are normal. Vision grossly intact. No visual field deficit.       Right eye: No discharge.        Left eye: No discharge.     Extraocular Movements: Extraocular movements intact.     Right eye: Normal extraocular motion and no nystagmus.  Conjunctiva/sclera: Conjunctivae normal.     Pupils: Pupils are equal, round, and reactive to light.     Comments: No subconjunctival hemorrhage, hyphema, tear drop pupil, or fluid leakage to the right eye.  Left eye pupils unresponsive secondary to her baseline blindness.  Vision grossly intact per patient  Neck:     Vascular: No carotid bruit.  Cardiovascular:     Rate and Rhythm: Normal rate.     Pulses: Normal pulses.          Radial pulses are 2+ on the right side and 2+ on the left side.       Dorsalis pedis pulses are 2+ on the right side and 2+ on the left side.  Pulmonary:     Effort: Pulmonary effort is normal. No respiratory distress.     Breath sounds: Normal breath sounds. No wheezing.  Chest:     Chest wall: No tenderness.  Abdominal:     General: Bowel sounds are normal. There is no distension.     Palpations: Abdomen is soft.      Tenderness: There is no abdominal tenderness. There is no guarding or rebound.  Musculoskeletal:     Cervical back: Full passive range of motion without pain, normal range of motion and neck supple. No deformity, rigidity or bony tenderness. Normal range of motion.     Thoracic back: No deformity or bony tenderness. Normal range of motion.     Lumbar back: No deformity or bony tenderness. Normal range of motion.     Right hip: No bony tenderness or crepitus.     Left hip: No bony tenderness or crepitus.     Right lower leg: No edema.     Left lower leg: No edema.     Comments: No obvious deformity to joints or long bones Pelvis stable with no shortening or rotation of LE bilaterally  Skin:    General: Skin is warm and dry.     Capillary Refill: Capillary refill takes less than 2 seconds.     Coloration: Skin is not jaundiced or pale.  Neurological:     General: No focal deficit present.     Mental Status: She is alert and oriented to person, place, and time. Mental status is at baseline.     GCS: GCS eye subscore is 4. GCS verbal subscore is 5. GCS motor subscore is 6.     Cranial Nerves: Cranial nerves 2-12 are intact. No cranial nerve deficit, dysarthria or facial asymmetry.     Sensory: Sensation is intact. No sensory deficit.     Motor: Motor function is intact. No weakness, tremor, atrophy, abnormal muscle tone, seizure activity or pronator drift.     Coordination: Coordination is intact. Coordination normal. Finger-Nose-Finger Test and Heel to [facility] Test normal.     Gait: Gait is intact. Gait normal.     Deep Tendon Reflexes: Reflexes are normal and symmetric. Reflexes normal.     Comments: A&oX3. following commands appropriately. Motor 5/5 and sensation 2/2 of BUE and BLE.     (all labs ordered are listed, but only abnormal results are displayed) Labs Reviewed - No data to display  EKG: None  Radiology: DG Hand Complete Left Result Date: 06/27/2024 CLINICAL DATA:  Fall  EXAM: LEFT HAND - COMPLETE 3+ VIEW COMPARISON:  Left hand x-ray 04/09/2012 FINDINGS: There is no evidence of fracture or dislocation. There is no evidence of arthropathy or other focal bone abnormality. There is soft tissue swelling surrounding the  wrist. Peripheral vascular calcifications are present. IMPRESSION: 1. No acute fracture or dislocation. 2. Soft tissue swelling surrounding the wrist. Electronically Signed   By: Greig Pique M.D.   On: 06/27/2024 22:26   DG Hand Complete Right Result Date: 06/27/2024 EXAM: 3 OR MORE VIEW(S) XRAY OF THE HAND 06/27/2024 10:12:00 PM COMPARISON: None available. CLINICAL HISTORY: pain, swelling FINDINGS: BONES AND JOINTS: No acute fracture. No focal osseous lesion. No joint dislocation. Mild osteoarthritis of distal radioulnar joint. Mild osteoarthritis of first CMC and MCP joints. SOFT TISSUES: Vascular calcifications noted. IMPRESSION: 1. No acute osseous abnormality. Electronically signed by: Norman Gatlin MD 06/27/2024 10:26 PM EST RP Workstation: [id number]   CT Cervical Spine Wo Contrast Result Date: 06/27/2024 EXAM: CT CERVICAL SPINE WITHOUT CONTRAST 06/27/2024 10:07:08 PM TECHNIQUE: CT of the cervical spine was performed without the administration of intravenous contrast. Multiplanar reformatted images are provided for review. Automated exposure control, iterative reconstruction, and/or weight based adjustment of the mA/kV was utilized to reduce the radiation dose to as low as reasonably achievable. COMPARISON: None available. CLINICAL HISTORY: neck pain FINDINGS: CERVICAL SPINE: BONES AND ALIGNMENT: No acute fracture or traumatic malalignment. DEGENERATIVE CHANGES: Multilevel spondylosis, disc space height loss, and degenerative endplate changes greatest at C4-C5 and C5-C6 where it is moderate. Advanced facet arthropathy on the left at C3-C4. Posterior disc osteophyte complex at C4-C5 causes moderate effacement of the ventral thecal sac. No severe spinal  canal narrowing. SOFT TISSUES: No prevertebral soft tissue swelling. IMPRESSION: 1. No acute fracture in the cervical spine. Electronically signed by: Norman Gatlin MD 06/27/2024 10:26 PM EST RP Workstation: [id number]   CT Head Wo Contrast Result Date: 06/27/2024 EXAM: CT HEAD WITHOUT 06/27/2024 10:07:08 PM TECHNIQUE: CT of the head was performed without the administration of intravenous contrast. Automated exposure control, iterative reconstruction, and/or weight based adjustment of the mA/kV was utilized to reduce the radiation dose to as low as reasonably achievable. COMPARISON: 11/10/2023 CLINICAL HISTORY: Head trauma, moderate-severe FINDINGS: BRAIN AND VENTRICLES: No acute intracranial hemorrhage. No mass effect or midline shift. No extra-axial fluid collection. No evidence of acute infarct. No hydrocephalus. ORBITS: Right lens replacement. Phthisis bulbi of left globe. SINUSES AND MASTOIDS: No acute abnormality. SOFT TISSUES AND SKULL: Small forehead contusion. No acute skull fracture. IMPRESSION: 1. No acute intracranial abnormality. 2. Small forehead contusion. Electronically signed by: Norman Gatlin MD 06/27/2024 10:22 PM EST RP Workstation: [id number]     Medications Ordered in the ED  Tdap (ADACEL ) injection 0.5 mL (0.5 mLs Intramuscular Given 06/27/24 2301)                                    Medical Decision Making Amount and/or Complexity of Data Reviewed Radiology: ordered.  Risk OTC drugs. Prescription drug management.   Patient presents to the ED for concern of bilateral hand pain, head injury following fall, this involves an extensive number of treatment options, and is a complaint that carries with it a high risk of complications and morbidity.  The differential diagnosis includes ICH, fracture, contusion, dislocation, ligamentous injury, concussion   Co morbidities that complicate the patient evaluation  See HPI   Additional history obtained:  Additional  history obtained from Nursing   External records from outside source obtained and reviewed including triage RN note    Imaging Studies ordered:  I ordered imaging studies including bilateral hand x-ray, CT head, cervical spine I independently visualized and interpreted imaging  which showed  No acute intracranial abnormality. Small forehead contusion I agree with the radiologist interpretation    Medicines ordered and prescription drug management:  I ordered medication including tdap, tylenol , voltaren   for pain, tetanus update  Reevaluation of the patient after these medicines showed that the patient improved I have reviewed the patients home medicines and have made adjustments as needed    Problem List / ED Course:  Fall Injury of head Small abrasion to right forehead does not require lack repair.  Did rinse out wound to ensure no infection As patient is unaware of her last Tdap.  I did update this today Otherwise is well-appearing.  No ecchymosis to chest, abdomen, back.  Low suspicion of intrathoracic, intra-abdominal traumatic injury.  No midline spinous tenderness.  No paresthesia to BUE nor BLE.  ROM of extremities appears intact. Vital signs hemodynamically stable no tachycardia no hypotension No complaints prior to fall.  Fall appears to be mechanical in nature.  Do not think lab work is required at this time. Did provide Percocet for pain with improvement Patient is unable to take ibuprofen  so I provided her with Tylenol , Voltaren  gel prescriptions for pain at home Patient has been taken home by sister who is comfortable with plan of discharge and can watch patient.  Patient is also comfortable with discharge.  Bilateral hand pain Pain from located in index fingers bilaterally.  Very mild swelling to right wrist.  No crepitus or obvious deformity on exam.  Brisk cap refill and BUE appear well perfused.  Radial 2+.  ROM seems intact but mildly limited secondary to pain  and patient discomfort X-ray negative for fracture but does show some mild wrist I did offer patient hand surgery recommendation for further follow-up and management however patient reports that she has an orthopedist that she wishes to use instead   Reevaluation:  After the interventions noted above, I reevaluated the patient and found that they have :improved    Dispostion:  After consideration of the diagnostic results and the patients response to treatment, I feel that the patent would benefit from outpatient management with PCP f/u.   Discussed ED workup, disposition, return to ED precautions with patient who expresses understanding agrees with plan.  All questions answered to their satisfaction.  They are agreeable to plan.  Discharge instructions provided on paperwork  Final diagnoses:  Fall, initial encounter  Injury of head, initial encounter  Bilateral hand pain    ED Discharge Orders          Ordered    diclofenac  Sodium (VOLTAREN ) 1 % GEL  4 times daily        06/27/24 2248    acetaminophen  (TYLENOL  8 HOUR) 650 MG CR tablet  Every 8 hours PRN        06/27/24 2248             Minnie Tinnie BRAVO, PA 06/29/24 [id number]    Freddi Hamilton, MD 06/29/24 1342

## 2024-06-27 NOTE — ED Notes (Signed)
 Pt reports is legally blind.   Small ~0.5 cm lac noted to R forehead. Bleeding controlled in triage.

## 2024-06-27 NOTE — Discharge Instructions (Signed)
 Thank you for letting us  evaluate you today.  I have provided you with concussion clinic follow-up if you have any symptoms of brain fog or lightheadedness.  You can follow-up with them.  Your CT imaging of your head did not show any bleeding.  Your x-rays did not show any fracture or dislocation.  Return to Emergency Department for experience dizziness at rest, worsening vision, chest pain, shortness of breath

## 2024-06-27 NOTE — ED Triage Notes (Addendum)
 Pt reports tripping over dog tonight, hitting head on door hinge, and landing on bilateral hands. C/o headache,neck pain, dizziness, blurry vision, bilateral hand pain. Swelling noted to bilateral hands. Denies blood thinners.
